# Patient Record
Sex: Female | Born: 1935 | Race: Black or African American | Hispanic: No | Smoking: Never smoker
Health system: Southern US, Community
[De-identification: ages and names within clinical notes are randomized; demographics above are authoritative.]

## PROBLEM LIST (undated history)

## (undated) DIAGNOSIS — E785 Hyperlipidemia, unspecified: Secondary | ICD-10-CM

## (undated) DIAGNOSIS — M1990 Unspecified osteoarthritis, unspecified site: Secondary | ICD-10-CM

## (undated) DIAGNOSIS — N838 Other noninflammatory disorders of ovary, fallopian tube and broad ligament: Secondary | ICD-10-CM

## (undated) DIAGNOSIS — I10 Essential (primary) hypertension: Secondary | ICD-10-CM

## (undated) DIAGNOSIS — M8580 Other specified disorders of bone density and structure, unspecified site: Secondary | ICD-10-CM

## (undated) HISTORY — DX: Hyperlipidemia, unspecified: E78.5

## (undated) HISTORY — DX: Essential (primary) hypertension: I10

## (undated) HISTORY — DX: Other specified disorders of bone density and structure, unspecified site: M85.80

## (undated) HISTORY — DX: Unspecified osteoarthritis, unspecified site: M19.90

---

## 1997-09-30 ENCOUNTER — Ambulatory Visit: Admission: RE | Admit: 1997-09-30 | Discharge: 1997-09-30 | Payer: Self-pay | Admitting: Internal Medicine

## 1998-10-05 ENCOUNTER — Encounter: Payer: Self-pay | Admitting: Internal Medicine

## 1998-10-05 ENCOUNTER — Ambulatory Visit (HOSPITAL_COMMUNITY): Admission: RE | Admit: 1998-10-05 | Discharge: 1998-10-05 | Payer: Self-pay | Admitting: Internal Medicine

## 1999-10-06 ENCOUNTER — Ambulatory Visit (HOSPITAL_COMMUNITY): Admission: RE | Admit: 1999-10-06 | Discharge: 1999-10-06 | Payer: Self-pay | Admitting: Radiation Oncology

## 2000-10-06 ENCOUNTER — Encounter: Admission: RE | Admit: 2000-10-06 | Discharge: 2000-10-06 | Payer: Self-pay | Admitting: Radiation Oncology

## 2000-12-07 ENCOUNTER — Emergency Department (HOSPITAL_COMMUNITY): Admission: EM | Admit: 2000-12-07 | Discharge: 2000-12-07 | Payer: Self-pay | Admitting: *Deleted

## 2001-10-30 ENCOUNTER — Encounter: Admission: RE | Admit: 2001-10-30 | Discharge: 2001-10-30 | Payer: Self-pay | Admitting: Internal Medicine

## 2001-10-30 ENCOUNTER — Encounter: Payer: Self-pay | Admitting: Internal Medicine

## 2002-11-01 ENCOUNTER — Encounter: Payer: Self-pay | Admitting: Internal Medicine

## 2002-11-01 ENCOUNTER — Encounter: Admission: RE | Admit: 2002-11-01 | Discharge: 2002-11-01 | Payer: Self-pay | Admitting: Internal Medicine

## 2003-11-03 ENCOUNTER — Encounter: Admission: RE | Admit: 2003-11-03 | Discharge: 2003-11-03 | Payer: Self-pay | Admitting: Internal Medicine

## 2004-11-08 ENCOUNTER — Encounter: Admission: RE | Admit: 2004-11-08 | Discharge: 2004-11-08 | Payer: Self-pay | Admitting: Internal Medicine

## 2005-05-17 ENCOUNTER — Other Ambulatory Visit: Admission: RE | Admit: 2005-05-17 | Discharge: 2005-05-17 | Payer: Self-pay | Admitting: Internal Medicine

## 2005-11-10 ENCOUNTER — Encounter: Admission: RE | Admit: 2005-11-10 | Discharge: 2005-11-10 | Payer: Self-pay | Admitting: Internal Medicine

## 2006-11-13 ENCOUNTER — Encounter: Admission: RE | Admit: 2006-11-13 | Discharge: 2006-11-13 | Payer: Self-pay | Admitting: Internal Medicine

## 2007-11-14 ENCOUNTER — Encounter: Admission: RE | Admit: 2007-11-14 | Discharge: 2007-11-14 | Payer: Self-pay | Admitting: Internal Medicine

## 2008-11-14 ENCOUNTER — Encounter: Admission: RE | Admit: 2008-11-14 | Discharge: 2008-11-14 | Payer: Self-pay | Admitting: Internal Medicine

## 2009-11-18 ENCOUNTER — Encounter: Admission: RE | Admit: 2009-11-18 | Discharge: 2009-11-18 | Payer: Self-pay | Admitting: Internal Medicine

## 2010-11-22 ENCOUNTER — Other Ambulatory Visit: Payer: Self-pay | Admitting: Internal Medicine

## 2010-11-22 DIAGNOSIS — Z1231 Encounter for screening mammogram for malignant neoplasm of breast: Secondary | ICD-10-CM

## 2010-12-17 ENCOUNTER — Ambulatory Visit
Admission: RE | Admit: 2010-12-17 | Discharge: 2010-12-17 | Disposition: A | Discharge status: Home / Self Care | Payer: PRIVATE HEALTH INSURANCE | Source: Ambulatory Visit | Attending: Internal Medicine | Admitting: Internal Medicine

## 2010-12-17 DIAGNOSIS — Z1231 Encounter for screening mammogram for malignant neoplasm of breast: Secondary | ICD-10-CM

## 2011-12-15 ENCOUNTER — Other Ambulatory Visit: Payer: Self-pay | Admitting: Internal Medicine

## 2011-12-15 DIAGNOSIS — Z1231 Encounter for screening mammogram for malignant neoplasm of breast: Secondary | ICD-10-CM

## 2012-02-06 ENCOUNTER — Ambulatory Visit
Admission: RE | Admit: 2012-02-06 | Discharge: 2012-02-06 | Disposition: A | Discharge status: Home / Self Care | Payer: Medicare HMO | Source: Ambulatory Visit | Attending: Internal Medicine | Admitting: Internal Medicine

## 2012-02-06 DIAGNOSIS — Z1231 Encounter for screening mammogram for malignant neoplasm of breast: Secondary | ICD-10-CM

## 2012-02-09 ENCOUNTER — Other Ambulatory Visit: Payer: Self-pay | Admitting: Internal Medicine

## 2012-02-09 DIAGNOSIS — R928 Other abnormal and inconclusive findings on diagnostic imaging of breast: Secondary | ICD-10-CM

## 2012-02-16 ENCOUNTER — Other Ambulatory Visit: Payer: Medicare HMO

## 2012-02-22 ENCOUNTER — Other Ambulatory Visit: Payer: Medicare HMO

## 2012-03-26 ENCOUNTER — Other Ambulatory Visit: Payer: Medicare HMO

## 2012-04-05 ENCOUNTER — Ambulatory Visit
Admission: RE | Admit: 2012-04-05 | Discharge: 2012-04-05 | Disposition: A | Discharge status: Home / Self Care | Payer: Medicare HMO | Source: Ambulatory Visit | Attending: Internal Medicine | Admitting: Internal Medicine

## 2012-04-05 DIAGNOSIS — R928 Other abnormal and inconclusive findings on diagnostic imaging of breast: Secondary | ICD-10-CM

## 2013-04-08 ENCOUNTER — Other Ambulatory Visit: Payer: Self-pay | Admitting: Internal Medicine

## 2013-04-08 DIAGNOSIS — R198 Other specified symptoms and signs involving the digestive system and abdomen: Secondary | ICD-10-CM

## 2013-04-11 ENCOUNTER — Ambulatory Visit
Admission: RE | Admit: 2013-04-11 | Discharge: 2013-04-11 | Disposition: A | Discharge status: Home / Self Care | Payer: Medicare HMO | Source: Ambulatory Visit | Attending: Internal Medicine | Admitting: Internal Medicine

## 2013-04-11 DIAGNOSIS — R198 Other specified symptoms and signs involving the digestive system and abdomen: Secondary | ICD-10-CM

## 2013-05-30 DIAGNOSIS — R1900 Intra-abdominal and pelvic swelling, mass and lump, unspecified site: Secondary | ICD-10-CM | POA: Insufficient documentation

## 2014-04-14 ENCOUNTER — Other Ambulatory Visit: Payer: Self-pay | Admitting: Internal Medicine

## 2014-04-14 DIAGNOSIS — Z1231 Encounter for screening mammogram for malignant neoplasm of breast: Secondary | ICD-10-CM

## 2014-04-14 DIAGNOSIS — Z9889 Other specified postprocedural states: Secondary | ICD-10-CM

## 2014-04-14 DIAGNOSIS — Z853 Personal history of malignant neoplasm of breast: Secondary | ICD-10-CM

## 2014-04-24 ENCOUNTER — Ambulatory Visit
Admission: RE | Admit: 2014-04-24 | Discharge: 2014-04-24 | Disposition: A | Discharge status: Home / Self Care | Payer: Medicare HMO | Source: Ambulatory Visit | Attending: Internal Medicine | Admitting: Internal Medicine

## 2014-04-24 DIAGNOSIS — Z9889 Other specified postprocedural states: Secondary | ICD-10-CM

## 2014-04-24 DIAGNOSIS — Z1231 Encounter for screening mammogram for malignant neoplasm of breast: Secondary | ICD-10-CM

## 2014-04-24 DIAGNOSIS — Z853 Personal history of malignant neoplasm of breast: Secondary | ICD-10-CM

## 2017-05-15 ENCOUNTER — Observation Stay (HOSPITAL_COMMUNITY)
Admission: EM | Admit: 2017-05-15 | Discharge: 2017-05-19 | Disposition: A | Discharge status: Home / Self Care | Payer: Medicare Other | Attending: Internal Medicine | Admitting: Internal Medicine

## 2017-05-15 ENCOUNTER — Emergency Department (HOSPITAL_COMMUNITY): Payer: Medicare Other

## 2017-05-15 ENCOUNTER — Other Ambulatory Visit: Payer: Self-pay

## 2017-05-15 DIAGNOSIS — E785 Hyperlipidemia, unspecified: Secondary | ICD-10-CM | POA: Diagnosis not present

## 2017-05-15 DIAGNOSIS — R531 Weakness: Secondary | ICD-10-CM | POA: Insufficient documentation

## 2017-05-15 DIAGNOSIS — N63 Unspecified lump in unspecified breast: Secondary | ICD-10-CM | POA: Diagnosis present

## 2017-05-15 DIAGNOSIS — N839 Noninflammatory disorder of ovary, fallopian tube and broad ligament, unspecified: Principal | ICD-10-CM | POA: Insufficient documentation

## 2017-05-15 DIAGNOSIS — M8580 Other specified disorders of bone density and structure, unspecified site: Secondary | ICD-10-CM | POA: Insufficient documentation

## 2017-05-15 DIAGNOSIS — M1990 Unspecified osteoarthritis, unspecified site: Secondary | ICD-10-CM | POA: Diagnosis not present

## 2017-05-15 DIAGNOSIS — N838 Other noninflammatory disorders of ovary, fallopian tube and broad ligament: Secondary | ICD-10-CM | POA: Diagnosis present

## 2017-05-15 DIAGNOSIS — R112 Nausea with vomiting, unspecified: Secondary | ICD-10-CM | POA: Diagnosis present

## 2017-05-15 DIAGNOSIS — R339 Retention of urine, unspecified: Secondary | ICD-10-CM | POA: Insufficient documentation

## 2017-05-15 DIAGNOSIS — M109 Gout, unspecified: Secondary | ICD-10-CM | POA: Diagnosis not present

## 2017-05-15 DIAGNOSIS — I10 Essential (primary) hypertension: Secondary | ICD-10-CM

## 2017-05-15 DIAGNOSIS — Z79899 Other long term (current) drug therapy: Secondary | ICD-10-CM | POA: Insufficient documentation

## 2017-05-15 DIAGNOSIS — Z853 Personal history of malignant neoplasm of breast: Secondary | ICD-10-CM | POA: Insufficient documentation

## 2017-05-15 DIAGNOSIS — R270 Ataxia, unspecified: Secondary | ICD-10-CM | POA: Diagnosis not present

## 2017-05-15 DIAGNOSIS — N179 Acute kidney failure, unspecified: Secondary | ICD-10-CM | POA: Insufficient documentation

## 2017-05-15 DIAGNOSIS — Z66 Do not resuscitate: Secondary | ICD-10-CM | POA: Diagnosis not present

## 2017-05-15 DIAGNOSIS — E860 Dehydration: Secondary | ICD-10-CM | POA: Diagnosis not present

## 2017-05-15 DIAGNOSIS — N6320 Unspecified lump in the left breast, unspecified quadrant: Secondary | ICD-10-CM | POA: Diagnosis not present

## 2017-05-15 DIAGNOSIS — M810 Age-related osteoporosis without current pathological fracture: Secondary | ICD-10-CM | POA: Diagnosis not present

## 2017-05-15 DIAGNOSIS — I700 Atherosclerosis of aorta: Secondary | ICD-10-CM | POA: Insufficient documentation

## 2017-05-15 DIAGNOSIS — K8020 Calculus of gallbladder without cholecystitis without obstruction: Secondary | ICD-10-CM | POA: Insufficient documentation

## 2017-05-15 DIAGNOSIS — Z923 Personal history of irradiation: Secondary | ICD-10-CM | POA: Insufficient documentation

## 2017-05-15 DIAGNOSIS — J90 Pleural effusion, not elsewhere classified: Secondary | ICD-10-CM | POA: Insufficient documentation

## 2017-05-15 DIAGNOSIS — N189 Chronic kidney disease, unspecified: Secondary | ICD-10-CM | POA: Insufficient documentation

## 2017-05-15 DIAGNOSIS — R2689 Other abnormalities of gait and mobility: Secondary | ICD-10-CM | POA: Insufficient documentation

## 2017-05-15 DIAGNOSIS — I129 Hypertensive chronic kidney disease with stage 1 through stage 4 chronic kidney disease, or unspecified chronic kidney disease: Secondary | ICD-10-CM | POA: Insufficient documentation

## 2017-05-15 DIAGNOSIS — Z7982 Long term (current) use of aspirin: Secondary | ICD-10-CM | POA: Diagnosis not present

## 2017-05-15 DIAGNOSIS — J320 Chronic maxillary sinusitis: Secondary | ICD-10-CM | POA: Insufficient documentation

## 2017-05-15 DIAGNOSIS — K5730 Diverticulosis of large intestine without perforation or abscess without bleeding: Secondary | ICD-10-CM | POA: Insufficient documentation

## 2017-05-15 HISTORY — DX: Other noninflammatory disorders of ovary, fallopian tube and broad ligament: N83.8

## 2017-05-15 LAB — COMPREHENSIVE METABOLIC PANEL
ALK PHOS: 60 U/L (ref 38–126)
ALT: 15 U/L (ref 14–54)
ANION GAP: 12 (ref 5–15)
AST: 25 U/L (ref 15–41)
Albumin: 4.1 g/dL (ref 3.5–5.0)
BILIRUBIN TOTAL: 0.8 mg/dL (ref 0.3–1.2)
BUN: 21 mg/dL — ABNORMAL HIGH (ref 6–20)
CALCIUM: 9.7 mg/dL (ref 8.9–10.3)
CO2: 23 mmol/L (ref 22–32)
Chloride: 107 mmol/L (ref 101–111)
Creatinine, Ser: 1.41 mg/dL — ABNORMAL HIGH (ref 0.44–1.00)
GFR calc Af Amer: 36 mL/min — ABNORMAL LOW (ref >60)
GFR, EST NON AFRICAN AMERICAN: 31 mL/min — AB (ref >60)
Glucose, Bld: 203 mg/dL — ABNORMAL HIGH (ref 65–99)
Potassium: 4 mmol/L (ref 3.5–5.1)
Sodium: 142 mmol/L (ref 135–145)
TOTAL PROTEIN: 7.6 g/dL (ref 6.5–8.1)

## 2017-05-15 LAB — URINALYSIS, ROUTINE W REFLEX MICROSCOPIC
BACTERIA UA: NONE SEEN
Bilirubin Urine: NEGATIVE
GLUCOSE, UA: NEGATIVE mg/dL
HGB URINE DIPSTICK: NEGATIVE
Ketones, ur: 5 mg/dL — AB
NITRITE: NEGATIVE
PH: 8 (ref 5.0–8.0)
Protein, ur: 30 mg/dL — AB
SPECIFIC GRAVITY, URINE: 1.014 (ref 1.005–1.030)

## 2017-05-15 LAB — CBC WITH DIFFERENTIAL/PLATELET
Basophils Absolute: 0 10*3/uL (ref 0.0–0.1)
Basophils Relative: 0 %
EOS ABS: 0.1 10*3/uL (ref 0.0–0.7)
Eosinophils Relative: 0 %
HCT: 39 % (ref 36.0–46.0)
HEMOGLOBIN: 12.6 g/dL (ref 12.0–15.0)
Lymphocytes Relative: 5 %
Lymphs Abs: 0.6 10*3/uL — ABNORMAL LOW (ref 0.7–4.0)
MCH: 28.8 pg (ref 26.0–34.0)
MCHC: 32.3 g/dL (ref 30.0–36.0)
MCV: 89 fL (ref 78.0–100.0)
MONO ABS: 0.5 10*3/uL (ref 0.1–1.0)
MONOS PCT: 4 %
NEUTROS ABS: 11.1 10*3/uL — AB (ref 1.7–7.7)
NEUTROS PCT: 91 %
Platelets: 178 10*3/uL (ref 150–400)
RBC: 4.38 MIL/uL (ref 3.87–5.11)
RDW: 12.9 % (ref 11.5–15.5)
WBC: 12.4 10*3/uL — ABNORMAL HIGH (ref 4.0–10.5)

## 2017-05-15 LAB — I-STAT TROPONIN, ED: TROPONIN I, POC: 0 ng/mL (ref 0.00–0.08)

## 2017-05-15 LAB — LIPASE, BLOOD: LIPASE: 27 U/L (ref 11–51)

## 2017-05-15 MED ORDER — IOPAMIDOL (ISOVUE-300) INJECTION 61%
80.0000 mL | Freq: Once | INTRAVENOUS | Status: AC | PRN
  Administered 2017-05-15: 100 mL via INTRAVENOUS

## 2017-05-15 MED ORDER — SODIUM CHLORIDE 0.9 % IJ SOLN
INTRAMUSCULAR | Status: AC
  Filled 2017-05-15: qty 50

## 2017-05-15 MED ORDER — ONDANSETRON HCL 4 MG/2ML IJ SOLN
4.0000 mg | Freq: Once | INTRAMUSCULAR | Status: AC
  Administered 2017-05-15: 4 mg via INTRAVENOUS
  Filled 2017-05-15: qty 2

## 2017-05-15 MED ORDER — ONDANSETRON HCL 4 MG/2ML IJ SOLN
4.0000 mg | Freq: Once | INTRAMUSCULAR | Status: AC
Start: 2017-05-15 — End: 2017-05-15
  Administered 2017-05-15: 4 mg via INTRAVENOUS
  Filled 2017-05-15: qty 2

## 2017-05-15 MED ORDER — IOPAMIDOL (ISOVUE-300) INJECTION 61%
INTRAVENOUS | Status: AC
  Filled 2017-05-15: qty 100

## 2017-05-15 MED ORDER — SODIUM CHLORIDE 0.9 % IV BOLUS
1000.0000 mL | Freq: Once | INTRAVENOUS | Status: AC
  Administered 2017-05-16: 1000 mL via INTRAVENOUS

## 2017-05-15 NOTE — ED Provider Notes (Addendum)
[facility]-EMERGENCY DEPT Provider Note   CSN: [id number] Arrival date & time: 05/15/17  1830     History   Chief Complaint Chief Complaint  Patient presents with  . Nausea  . Emesis    HPI Ann Cameron is a 84 y.o. female with history of hypertension, hyperlipidemia,breast cancer status post radiation tx,pelvic mass is here for evaluation of sudden onset nausea and nonbilious nonbloody emesis around 3:30 PM today. Had prodromal brief episode of dizziness while sitting down before nausea and vomiting. Last intake was lunch around noon. States that she threw up what she had eaten for lunch. Has had persistent nausea and emesis since, feels like emesis is slowing down but nausea is persistent. Last BM was normal this afternoon. Other associated symptoms include "abdominal discomfort" while actively vomiting, no abdominal discomfort otherwise. She has had no fevers, chest pain, shortness of breath, cough, abdominal pain, dysuria, hematuria, urinary frequency,changes in bowel movements. No known sick contacts at living facility. States she has a known mass in her abdomen, has decided to avoid invasive management for this. She follows up with Dr. Nehemiah SettlePolite every 6 months for routine checkup. Alleviating factors: zofran. Aggravating factors: none.   HPI  Past Medical History:  Diagnosis Date  . Arthritis   . HTN (hypertension)   . Hyperlipemia   . Osteopenia   . Ovarian mass     Patient Active Problem List   Diagnosis Date Noted  . Ovarian mass 05/16/2017    ** The histories are not reviewed yet. Please review them in the "History" navigator section and refresh this SmartLink.   OB History   None      Home Medications    Prior to Admission medications   Medication Sig Start Date End Date Taking? Authorizing Provider  amLODipine (NORVASC) 10 MG tablet Take 10 mg by mouth daily.   Yes [provider]  aspirin 81 MG tablet Take 81 mg by mouth  daily.   Yes [provider]  Calcium Carbonate-Vitamin D (CALCIUM-D) 600-400 MG-UNIT TABS Take 1 tablet by mouth daily. With food   Yes [provider]  fosinopril (MONOPRIL) 20 MG tablet Take 20 mg by mouth daily.   Yes [provider]  LUMIGAN 0.01 % SOLN Place 1 drop into both eyes at bedtime.  03/20/17  Yes [provider]  meloxicam (MOBIC) 7.5 MG tablet Take 7.5 mg by mouth daily.   Yes [provider]  Multiple Vitamins-Minerals (MULTIVITAMIN WITH MINERALS) tablet Take 1 tablet by mouth daily.   Yes [provider]    Family History No family history on file.  Social History Social History   Tobacco Use  . Smoking status: Not on file  Substance Use Topics  . Alcohol use: Not on file  . Drug use: Not on file     Allergies   Patient has no known allergies.   Review of Systems Review of Systems  Gastrointestinal: Positive for nausea and vomiting.  All other systems reviewed and are negative.    Physical Exam Updated Vital Signs BP (!) 155/60   Pulse 85   Temp 97.8 F (36.6 C) (Oral)   Resp 17   Ht 5\' 1"  (1.549 m)   Wt 67.6 kg (149 lb)   SpO2 95%   BMI 28.15 kg/m   Physical Exam  Constitutional: She is oriented to person, place, and time. She appears well-developed and well-nourished. No distress.  Non toxic  HENT:  Head: Normocephalic  and atraumatic.  Nose: Nose normal.  Mouth/Throat: No oropharyngeal exudate.  Moist mucous membranes   Eyes: Pupils are equal, round, and reactive to light. Conjunctivae and EOM are normal.  Neck: Normal range of motion.  Cardiovascular: Normal rate, regular rhythm and intact distal pulses.  No murmur heard. 2+ DP and radial pulses bilaterally. No LE edema.   Pulmonary/Chest: Effort normal and breath sounds normal. No respiratory distress. She has no wheezes. She has no rales.  Abdominal: Soft. Bowel sounds are normal. She exhibits distension and mass. There is no  tenderness.  Moderate abdominal distention noted. Palpable, mass to lower abdomen extending up to the umbilicus, this is nontender with dullness to percussion. No guarding, rebound. No suprapubic or CVA tenderness. Diminished bowel sounds.  Musculoskeletal: Normal range of motion. She exhibits no deformity.  Neurological: She is alert and oriented to person, place, and time.  Skin: Skin is warm and dry. Capillary refill takes less than 2 seconds.  Psychiatric: She has a normal mood and affect. Her behavior is normal. Judgment and thought content normal.  Nursing note and vitals reviewed.    ED Treatments / Results  Labs (all labs ordered are listed, but only abnormal results are displayed) Labs Reviewed  CBC WITH DIFFERENTIAL/PLATELET - Abnormal; Notable for the following components:      Result Value   WBC 12.4 (*)    Neutro Abs 11.1 (*)    Lymphs Abs 0.6 (*)    All other components within normal limits  COMPREHENSIVE METABOLIC PANEL - Abnormal; Notable for the following components:   Glucose, Bld 203 (*)    BUN 21 (*)    Creatinine, Ser 1.41 (*)    GFR calc non Af Amer 31 (*)    GFR calc Af Amer 36 (*)    All other components within normal limits  URINALYSIS, ROUTINE W REFLEX MICROSCOPIC - Abnormal; Notable for the following components:   Ketones, ur 5 (*)    Protein, ur 30 (*)    Leukocytes, UA TRACE (*)    Squamous Epithelial / LPF 0-5 (*)    All other components within normal limits  URINE CULTURE  LIPASE, BLOOD  I-STAT TROPONIN, ED    EKG EKG Interpretation  Date/Time:  Monday May 15 2017 20:07:23 EDT Ventricular Rate:  89 PR Interval:    QRS Duration: 82 QT Interval:  399 QTC Calculation: 486 R Axis:   18 Text Interpretation:  Sinus rhythm Atrial premature complex Low voltage, precordial leads Borderline prolonged QT interval No previous ECGs available Confirmed by Alvira Monday ([id number]) on 05/15/2017 9:46:13 PM   Radiology Ct Abdomen Pelvis W  Contrast  Result Date: 05/15/2017 CLINICAL DATA:  Nausea and vomiting EXAM: CT ABDOMEN AND PELVIS WITH CONTRAST TECHNIQUE: Multidetector CT imaging of the abdomen and pelvis was performed using the standard protocol following bolus administration of intravenous contrast. CONTRAST:  ISOVUE-300 IOPAMIDOL (ISOVUE-300) INJECTION 61% COMPARISON:  04/11/2013 FINDINGS: Lower chest: Lung bases demonstrate no acute consolidation or pleural effusion. Borderline cardiomegaly. Partially visualized 19 mm mass in the outer left breast. Mild esophageal hiatal hernia. Hepatobiliary: No focal hepatic abnormality. Calcified stones in the gallbladder. Possible mild gallbladder wall thickening or edema. No biliary dilatation Pancreas: Unremarkable. No pancreatic ductal dilatation or surrounding inflammatory changes. Spleen: Normal in size without focal abnormality. Adrenals/Urinary Tract: Adrenal glands are within normal limits. Indeterminate hypodense lesion upper pole left kidney measuring 1 cm. Subcentimeter hypodense renal lesions too small to further characterize. No hydronephrosis. Distended bladder. Stomach/Bowel:  Stomach nonenlarged. No dilated small bowel. Diffuse diverticular disease of the colon without acute inflammation. Vascular/Lymphatic: Moderate aortic atherosclerosis. No aneurysmal dilatation. No significantly enlarged lymph nodes. Reproductive: Interval increase in size of a large complex cystic mass filling the abdomen and pelvis, now measuring approximately 26 cm transverse by 15.5 cm AP by 17.7 cm craniocaudad Other: Negative for free air or significant free fluid. Musculoskeletal: Stable sclerotic lesion in L3. Grade 1 anterolisthesis of L4 on L5. Degenerative changes at L5-S1. IMPRESSION: 1. Interval increase in size of a massive complex cystic and solid mass with calcifications, now measuring up to 26 cm, concerning for ovarian neoplasm. 2. Gallstones. Possible wall thickening or gallbladder wall edema.  Correlation with right upper quadrant abdominal ultrasound suggested. 3. Diffuse diverticular disease of the colon without acute inflammation. 4. Partially visualized 19 mm slightly dense mass in the outer left breast. Correlation with outpatient mammography and ultrasound if felt clinically indicated. Electronically Signed   By: Jasmine Pang M.D.   On: 05/15/2017 23:09    Procedures Procedures (including critical care time)  Medications Ordered in ED Medications  iopamidol (ISOVUE-300) 61 % injection (has no administration in time range)  sodium chloride 0.9 % injection (has no administration in time range)  sodium chloride 0.9 % bolus 1,000 mL (has no administration in time range)  ondansetron (ZOFRAN) injection 4 mg (4 mg Intravenous Given 05/15/17 1933)  iopamidol (ISOVUE-300) 61 % injection 80 mL (100 mLs Intravenous Contrast Given 05/15/17 2222)  ondansetron (ZOFRAN) injection 4 mg (4 mg Intravenous Given 05/15/17 2245)     Initial Impression / Assessment and Plan / ED Course  I have reviewed the triage vital signs and the nursing notes.  Pertinent labs & imaging results that were available during my care of the patient were reviewed by me and considered in my medical decision making (see chart for details).  Clinical Course as of Apr 23 0001  Mon May 15, 2017  2114 Creatinine(!): 1.41 [CG]  2114 GFR, Est African American(!): 36 [CG]  2114 WBC(!): 12.4 [CG]  2120 Patient states she has had emesis every 45 minutes since arrival in the ED . Her nausea has improved. Abdominal CT ordered.   [MA]  2141 Re-evaluated pt. VSS. Denies nausea, reports 1-2 episodes of nbnb emesis while in Ed.    [CG]  2318 IMPRESSION: 1. Interval increase in size of a massive complex cystic and solid mass with calcifications, now measuring up to 26 cm, concerning for ovarian neoplasm. 2. Gallstones. Possible wall thickening or gallbladder wall edema. Correlation with right upper quadrant abdominal  ultrasound suggested. 3. Diffuse diverticular disease of the colon without acute inflammation. 4. Partially visualized 19 mm slightly dense mass in the outer left breast. Correlation with outpatient mammography and ultrasound if felt clinically indicated.    CT ABDOMEN PELVIS W CONTRAST [CG]    Clinical Course User Index [CG] Liberty Handy, PA-C [MA] Roma Kayser, Student-PA   Ddx includes gastroenteritis. She has a known large pelvic mass per last heme/oncology note which is palpable but non tender on exam today.  Also considering SBO, perforated viscus, pyelonephritis/UTI less likely. She has no CP/SOB to suggest ACS/MI.   Labs remarkable for elevated creatinine 1.41 unchanged when compared with last CMP on 11/15/16. Mild leukocytosis WBC 12.4. Given known pelvic mass, sudden onset nausea and vomiting, will obtain CT abdomen/pelvis.   Final Clinical Impressions(s) / ED Diagnoses   2330: CT scan shows massive pelvis mass, enlarging, concern for ovarian neoplasm.  There is possible mild wall thickening/edema of gallbladder with gallstones seen on CT but she has no abd tenderness.  Will obtain a right upper quadrant ultrasound. I discussed findings with patient. States that she was seen by an OB/GYN 3 years ago when pelvic mass was found, remembers she was told it was "maybe benign". Her sister was very ill during this time and she had no associated symptoms and has not had subsequent f/u for this. Has noticed more rapid enlargement in the last 4 months.  Had brief discussion with patient regarding goals of care, she is very aware of risk of invasive procedures and is contemplating her options. Will request admission for further management of this, potential surgery consult vs palliative consult. Pt shared with Dr. Kathrine Haddock.  Blue folder at bedside with [facility] and DNR status  Final diagnoses:  Gallstones    ED Discharge Orders    None         Jerrell Mylar 05/16/17 0005    Alvira Monday, MD 05/16/17 1041

## 2017-05-15 NOTE — H&P (Signed)
Ann Cameron:[id number] DOB: 1922-10-17 DOA: 05/15/2017     PCP: Renford Dills, MD   Outpatient Specialists:  NONE  Patient arrived to ER on 05/15/17 at 1830  Patient coming from:  [facility]  Chief Complaint:  Chief Complaint  Patient presents with  . Nausea  . Emesis    HPI: Ann Cameron is a 84 y.o. female with medical history significant of pelvic mass , HTN, dyslipidemia, osteoporosis, breast cancer status post radiation and lumpectomy, gout, CKD baseline cr 1.4  Presented with vomiting started today EMS was called initially orthostatics were negative on the arrival blood pressure 176/82 She did have a slight episode of lightheadedness prior to initiation nausea and vomiting she vomited which she had for lunch.  She has had persistent nausea ever since but vomiting have stopped no associated fevers chest pain or shortness of breath.  She endorses that she has been feeling unsteady on her feet no localizing neurological deficits otherwise.  Reports about a year ago she has noticed a mass in her left breast she has history of breast cancer diagnosed in 1991 in the same breast.  Mass feels similar  Regarding pertinent Chronic problems: Known pelvic mass diagnosed in 2015 patient chose to not undergo surgical resection   While in ER:   Following Medications were ordered in ER: Medications  iopamidol (ISOVUE-300) 61 % injection (has no administration in time range)  sodium chloride 0.9 % injection (has no administration in time range)  ondansetron (ZOFRAN) injection 4 mg (4 mg Intravenous Given 05/15/17 1933)  iopamidol (ISOVUE-300) 61 % injection 80 mL (100 mLs Intravenous Contrast Given 05/15/17 2222)  ondansetron (ZOFRAN) injection 4 mg (4 mg Intravenous Given 05/15/17 2245)    Significant initial  Findings: Abnormal Labs Reviewed  CBC WITH DIFFERENTIAL/PLATELET - Abnormal; Notable for the following components:   Result Value   WBC 12.4 (*)    Neutro Abs 11.1 (*)    Lymphs Abs 0.6 (*)    All other components within normal limits  COMPREHENSIVE METABOLIC PANEL - Abnormal; Notable for the following components:   Glucose, Bld 203 (*)    BUN 21 (*)    Creatinine, Ser 1.41 (*)    GFR calc non Af Amer 31 (*)    GFR calc Af Amer 36 (*)    All other components within normal limits  URINALYSIS, ROUTINE W REFLEX MICROSCOPIC - Abnormal; Notable for the following components:   Ketones, ur 5 (*)    Protein, ur 30 (*)    Leukocytes, UA TRACE (*)    Squamous Epithelial / LPF 0-5 (*)    All other components within normal limits     Na 142 K 4.0 Lipase 27 Cr  baseline Lab Results  Component Value Date   CREATININE 1.41 (H) 05/15/2017     HG/HCT  Stable,     Component Value Date/Time   HGB 12.6 05/15/2017 1942   HCT 39.0 05/15/2017 1942   Troponin (Point of Care Test) Recent Labs    05/15/17 2043  TROPIPOC 0.00     BNP (last 3 results) No results for input(s): BNP in the last 8760 hours.  ProBNP (last 3 results) No results for input(s): PROBNP in the last 8760 hours.  Lactic Acid, Venous No results found for: LATICACIDVEN    UA  no evidence of UTI     CTabd/pelvis -enlargement of previously noted pelvic mass from 14 cm now up to 26  cm worrisome for ovarian neoplasm Evidence of gallstones possible mass of the left breast.  ECG:  Personally reviewed by me showing: HR  89 Rhythm:  NSR  no evidence of ischemic changes [id number]      ED Triage Vitals  Enc Vitals Group     BP 05/15/17 1850 (!) 175/69     Pulse Rate 05/15/17 1850 95     Resp 05/15/17 1850 20     Temp 05/15/17 1850 97.8 F (36.6 C)     Temp Source 05/15/17 1850 Oral     SpO2 05/15/17 1839 97 %     Weight 05/15/17 1852 149 lb (67.6 kg)     Height 05/15/17 1852 5\' 1"  (1.549 m)     Head Circumference --      Peak Flow --      Pain Score 05/15/17 1851 0     Pain Loc --      Pain Edu? --      Excl. in GC? --    TMAX(24)@       Latest  Blood pressure (!) 155/60, pulse 85, temperature 97.8 F (36.6 C), temperature source Oral, resp. rate 17, height 5\' 1"  (1.549 m), weight 67.6 kg (149 lb), SpO2 95 %.   Hospitalist was called for admission for nausea vomiting and worsening ovarian mass as well as new left breast mass   Review of Systems:    Pertinent positives include: lightheadedness, nausea, vomiting, vertigo  gait abnormality, Constitutional:  No weight loss, night sweats, Fevers, chills, fatigue, weight loss  HEENT:  No headaches, Difficulty swallowing,Tooth/dental problems,Sore throat,  No sneezing, itching, ear ache, nasal congestion, post nasal drip,  Cardio-vascular:  No chest pain, Orthopnea, PND, anasarca, dizziness, palpitations.no Bilateral lower extremity swelling  GI:  No heartburn, indigestion, abdominal pain, diarrhea, change in bowel habits, loss of appetite, melena, blood in stool, hematemesis Resp:  no shortness of breath at rest. No dyspnea on exertion, No excess mucus, no productive cough, No non-productive cough, No coughing up of blood.No change in color of mucus.No wheezing. Skin:  no rash or lesions. No jaundice GU:  no dysuria, change in color of urine, no urgency or frequency. No straining to urinate.  No flank pain.  Musculoskeletal:  No joint pain or no joint swelling. No decreased range of motion. No back pain.  Psych:  No change in mood or affect. No depression or anxiety. No memory loss.  Neuro: no localizing neurological complaints, no tingling, no weakness, no double vision, no no slurred speech, no confusion  As per HPI otherwise 10 point review of systems negative.   Past Medical History:   Past Medical History:  Diagnosis Date  . Arthritis   . HTN (hypertension)   . Hyperlipemia   . Osteopenia       History reviewed. No pertinent surgical history.  Social History:  Ambulatory   independently       reports that she has never smoked.  She has never used smokeless tobacco. She reports that she drank alcohol. Her drug history is not on file.     Family History:   Family History  Problem Relation Age of Onset  . Ovarian cancer Sister     Allergies: No Known Allergies   Prior to Admission medications   Medication Sig Start Date End Date Taking? Authorizing Provider  amLODipine (NORVASC) 10 MG tablet Take 10 mg by mouth daily.   Yes [provider]  aspirin 81 MG tablet Take 81 mg  by mouth daily.   Yes [provider]  Calcium Carbonate-Vitamin D (CALCIUM-D) 600-400 MG-UNIT TABS Take 1 tablet by mouth daily. With food   Yes [provider]  fosinopril (MONOPRIL) 20 MG tablet Take 20 mg by mouth daily.   Yes [provider]  LUMIGAN 0.01 % SOLN Place 1 drop into both eyes at bedtime.  03/20/17  Yes [provider]  meloxicam (MOBIC) 7.5 MG tablet Take 7.5 mg by mouth daily.   Yes [provider]  Multiple Vitamins-Minerals (MULTIVITAMIN WITH MINERALS) tablet Take 1 tablet by mouth daily.   Yes [provider]   Physical Exam: Blood pressure (!) 155/60, pulse 85, temperature 97.8 F (36.6 C), temperature source Oral, resp. rate 17, height 5\' 1"  (1.549 m), weight 67.6 kg (149 lb), SpO2 95 %. 1. General:  in No Acute distress  well  -appearing 2. Psychological: Alert and   Oriented 3. Head/ENT:     Dry Mucous Membranes                          Head Non traumatic, neck supple                          Poor Dentition 4. SKIN:  decreased Skin turgor,  Skin clean Dry and intact no rash 5. Heart: Regular rate and rhythm no  Murmur, no Rub or gallop 6. Lungs: Clear to auscultation bilaterally, no wheezes or crackles   7. Abdomen: Soft,  non-tender,   distended  bowel sounds present large abdominal mass noted  8. Lower extremities: no clubbing, cyanosis, or edema 9. Neurologically   strength 5 out of 5 in all 4 extremities cranial nerves II through XII  intact 10. MSK: Normal range of motion 11. Breast exam significant for firm immobile mass attached to skin 4 cm from the nipple at 2:00 measuring about 3 x 2 cm  LABS:     Recent Labs  Lab 05/15/17 1942  WBC 12.4*  NEUTROABS 11.1*  HGB 12.6  HCT 39.0  MCV 89.0  PLT 178   Basic Metabolic Panel: Recent Labs  Lab 05/15/17 1942  NA 142  K 4.0  CL 107  CO2 23  GLUCOSE 203*  BUN 21*  CREATININE 1.41*  CALCIUM 9.7      Recent Labs  Lab 05/15/17 1942  AST 25  ALT 15  ALKPHOS 60  BILITOT 0.8  PROT 7.6  ALBUMIN 4.1   Recent Labs  Lab 05/15/17 1942  LIPASE 27   No results for input(s): AMMONIA in the last 168 hours.    HbA1C: No results for input(s): HGBA1C in the last 72 hours. CBG: No results for input(s): GLUCAP in the last 168 hours.    Urine analysis:    Component Value Date/Time   COLORURINE YELLOW 05/15/2017 2222   APPEARANCEUR CLEAR 05/15/2017 2222   LABSPEC 1.014 05/15/2017 2222   PHURINE 8.0 05/15/2017 2222   GLUCOSEU NEGATIVE 05/15/2017 2222   HGBUR NEGATIVE 05/15/2017 2222   BILIRUBINUR NEGATIVE 05/15/2017 2222   KETONESUR 5 (A) 05/15/2017 2222   PROTEINUR 30 (A) 05/15/2017 2222   NITRITE NEGATIVE 05/15/2017 2222   LEUKOCYTESUR TRACE (A) 05/15/2017 2222       Cultures: No results found for: SDES, SPECREQUEST, CULT, REPTSTATUS   Radiological Exams on Admission: Ct Abdomen Pelvis W Contrast  Result Date: 05/15/2017 CLINICAL DATA:  Nausea and vomiting EXAM: CT ABDOMEN AND  PELVIS WITH CONTRAST TECHNIQUE: Multidetector CT imaging of the abdomen and pelvis was performed using the standard protocol following bolus administration of intravenous contrast. CONTRAST:  ISOVUE-300 IOPAMIDOL (ISOVUE-300) INJECTION 61% COMPARISON:  04/11/2013 FINDINGS: Lower chest: Lung bases demonstrate no acute consolidation or pleural effusion. Borderline cardiomegaly. Partially visualized 19 mm mass in the outer left breast. Mild esophageal hiatal hernia.  Hepatobiliary: No focal hepatic abnormality. Calcified stones in the gallbladder. Possible mild gallbladder wall thickening or edema. No biliary dilatation Pancreas: Unremarkable. No pancreatic ductal dilatation or surrounding inflammatory changes. Spleen: Normal in size without focal abnormality. Adrenals/Urinary Tract: Adrenal glands are within normal limits. Indeterminate hypodense lesion upper pole left kidney measuring 1 cm. Subcentimeter hypodense renal lesions too small to further characterize. No hydronephrosis. Distended bladder. Stomach/Bowel: Stomach nonenlarged. No dilated small bowel. Diffuse diverticular disease of the colon without acute inflammation. Vascular/Lymphatic: Moderate aortic atherosclerosis. No aneurysmal dilatation. No significantly enlarged lymph nodes. Reproductive: Interval increase in size of a large complex cystic mass filling the abdomen and pelvis, now measuring approximately 26 cm transverse by 15.5 cm AP by 17.7 cm craniocaudad Other: Negative for free air or significant free fluid. Musculoskeletal: Stable sclerotic lesion in L3. Grade 1 anterolisthesis of L4 on L5. Degenerative changes at L5-S1. IMPRESSION: 1. Interval increase in size of a massive complex cystic and solid mass with calcifications, now measuring up to 26 cm, concerning for ovarian neoplasm. 2. Gallstones. Possible wall thickening or gallbladder wall edema. Correlation with right upper quadrant abdominal ultrasound suggested. 3. Diffuse diverticular disease of the colon without acute inflammation. 4. Partially visualized 19 mm slightly dense mass in the outer left breast. Correlation with outpatient mammography and ultrasound if felt clinically indicated. Electronically Signed   By: Jasmine Pang M.D.   On: 05/15/2017 23:09    Chart has been reviewed    Assessment/Plan   84 y.o. female with medical history significant of pelvic mass , HTN, dyslipidemia, osteoporosis, breast cancer status post radiation  and lumpectomy, gout, CKD baseline cr 1.4  Admitted for nausea vomiting and worsening ovarian mass as well as new left breast mass   Present on Admission: . Ovarian mass -most likely ovarian malignancy with progression.  Patient this point states she is unlikely to desire surgical intervention but would like to have her options first explored.  Would be interested in palliative care discussion palliative care consult. . Cholelithiasis -will obtain right upper quadrant ultrasound to further investigate if evidence of cholecystitis patient states she would be interested in pursuing management . HTN (hypertension) stable continue home medications . Nausea and vomiting -multifactorial but given neurological associated complaints such as vertigo and ataxia with known history of likely malignancy will obtain brain imaging.  Given advanced age will cycle cardiac enzymes given lightheadedness nausea and vomiting . Breast mass in female -this is new diagnosis but patient has been aware of a mass for the past year.  She is unsure if like further workup or not given her advanced age.  Would like further information prior to do making a decision . Dehydration will rehydrate CKD creatinine currently stable  Other plan as per orders.  DVT prophylaxis:  SCD     Code Status:   DNR/DNI as per patient   I had personally discussed CODE STATUS with patient   I had spent discussing goals of care and CODE STATUS  Family Communication:   Family not  at  Bedside    Disposition Plan:  Back to current facility when stable                          Would benefit from PT/OT eval prior to DC   ordered                 Social Work  Palliative care   consulted                          Consults called: none   Admission status   inpatient      Level of care     tele        Therisa Doyne 05/15/2017, 12:55 AM    [facility]  Pager [phone]   after 2 AM please page  floor coverage PA If 7AM-7PM, please contact the day team taking care of the patient  [email or web address]  Password [email or web address]

## 2017-05-15 NOTE — ED Notes (Signed)
ED Provider at bedside. 

## 2017-05-15 NOTE — ED Notes (Signed)
Bed: [id number] Expected date:  Expected time:  Means of arrival:  Comments: 84 yo f nv

## 2017-05-15 NOTE — ED Triage Notes (Signed)
[facility]. Nausea and vomiting started today at 1530. Orthostatic BP negative per EMS. Afebrile. Denies pain. BP 176/82, P 91, RR 20, 97% sat on room air. Finger stick glucose 191.

## 2017-05-16 ENCOUNTER — Inpatient Hospital Stay (HOSPITAL_COMMUNITY): Payer: Medicare Other

## 2017-05-16 ENCOUNTER — Encounter (HOSPITAL_COMMUNITY): Payer: Self-pay | Admitting: Internal Medicine

## 2017-05-16 DIAGNOSIS — E860 Dehydration: Secondary | ICD-10-CM | POA: Diagnosis present

## 2017-05-16 DIAGNOSIS — N63 Unspecified lump in unspecified breast: Secondary | ICD-10-CM | POA: Diagnosis not present

## 2017-05-16 DIAGNOSIS — I10 Essential (primary) hypertension: Secondary | ICD-10-CM | POA: Diagnosis not present

## 2017-05-16 DIAGNOSIS — N838 Other noninflammatory disorders of ovary, fallopian tube and broad ligament: Secondary | ICD-10-CM | POA: Diagnosis present

## 2017-05-16 DIAGNOSIS — K8020 Calculus of gallbladder without cholecystitis without obstruction: Secondary | ICD-10-CM | POA: Insufficient documentation

## 2017-05-16 DIAGNOSIS — Z66 Do not resuscitate: Secondary | ICD-10-CM | POA: Diagnosis not present

## 2017-05-16 DIAGNOSIS — R112 Nausea with vomiting, unspecified: Secondary | ICD-10-CM | POA: Diagnosis not present

## 2017-05-16 DIAGNOSIS — N839 Noninflammatory disorder of ovary, fallopian tube and broad ligament, unspecified: Secondary | ICD-10-CM | POA: Diagnosis not present

## 2017-05-16 LAB — COMPREHENSIVE METABOLIC PANEL
ALK PHOS: 53 U/L (ref 38–126)
ALT: 13 U/L — ABNORMAL LOW (ref 14–54)
AST: 23 U/L (ref 15–41)
Albumin: 3.6 g/dL (ref 3.5–5.0)
Anion gap: 9 (ref 5–15)
BUN: 14 mg/dL (ref 6–20)
CALCIUM: 9 mg/dL (ref 8.9–10.3)
CO2: 21 mmol/L — AB (ref 22–32)
Chloride: 109 mmol/L (ref 101–111)
Creatinine, Ser: 1.08 mg/dL — ABNORMAL HIGH (ref 0.44–1.00)
GFR, EST AFRICAN AMERICAN: 50 mL/min — AB (ref >60)
GFR, EST NON AFRICAN AMERICAN: 43 mL/min — AB (ref >60)
Glucose, Bld: 98 mg/dL (ref 65–99)
Potassium: 4 mmol/L (ref 3.5–5.1)
SODIUM: 139 mmol/L (ref 135–145)
Total Bilirubin: 0.8 mg/dL (ref 0.3–1.2)
Total Protein: 6.6 g/dL (ref 6.5–8.1)

## 2017-05-16 LAB — HEMOGLOBIN A1C
HEMOGLOBIN A1C: 4.6 % — AB (ref 4.8–5.6)
Mean Plasma Glucose: 85.32 mg/dL

## 2017-05-16 LAB — CBC
HCT: 37.1 % (ref 36.0–46.0)
HEMOGLOBIN: 11.6 g/dL — AB (ref 12.0–15.0)
MCH: 28 pg (ref 26.0–34.0)
MCHC: 31.3 g/dL (ref 30.0–36.0)
MCV: 89.4 fL (ref 78.0–100.0)
PLATELETS: 274 10*3/uL (ref 150–400)
RBC: 4.15 MIL/uL (ref 3.87–5.11)
RDW: 13.1 % (ref 11.5–15.5)
WBC: 11.2 10*3/uL — AB (ref 4.0–10.5)

## 2017-05-16 LAB — TROPONIN I
Troponin I: 0.04 ng/mL (ref <0.03)
Troponin I: 0.04 ng/mL (ref <0.03)

## 2017-05-16 LAB — MAGNESIUM: MAGNESIUM: 1.7 mg/dL (ref 1.7–2.4)

## 2017-05-16 LAB — MRSA PCR SCREENING: MRSA BY PCR: NEGATIVE

## 2017-05-16 LAB — PHOSPHORUS: Phosphorus: 2.4 mg/dL — ABNORMAL LOW (ref 2.5–4.6)

## 2017-05-16 LAB — TSH: TSH: 1.025 u[IU]/mL (ref 0.350–4.500)

## 2017-05-16 MED ORDER — ACETAMINOPHEN 325 MG PO TABS: 650.0000 mg | ORAL_TABLET | Freq: Four times a day (QID) | ORAL | Status: DC | PRN

## 2017-05-16 MED ORDER — ONDANSETRON HCL 4 MG PO TABS: 4.0000 mg | ORAL_TABLET | Freq: Four times a day (QID) | ORAL | Status: DC | PRN

## 2017-05-16 MED ORDER — GADOBENATE DIMEGLUMINE 529 MG/ML IV SOLN
15.0000 mL | Freq: Once | INTRAVENOUS | Status: AC | PRN
  Administered 2017-05-16: 14 mL via INTRAVENOUS

## 2017-05-16 MED ORDER — SODIUM CHLORIDE 0.9 % IV SOLN
INTRAVENOUS | Status: AC
  Administered 2017-05-16: 02:00:00 via INTRAVENOUS

## 2017-05-16 MED ORDER — AMLODIPINE BESYLATE 10 MG PO TABS
10.0000 mg | ORAL_TABLET | Freq: Every day | ORAL | Status: DC
  Administered 2017-05-16 – 2017-05-19 (×4): 10 mg via ORAL
  Filled 2017-05-16 (×4): qty 1

## 2017-05-16 MED ORDER — ONDANSETRON HCL 4 MG/2ML IJ SOLN: 4.0000 mg | Freq: Four times a day (QID) | INTRAMUSCULAR | Status: DC | PRN

## 2017-05-16 MED ORDER — LISINOPRIL 20 MG PO TABS
20.0000 mg | ORAL_TABLET | Freq: Every day | ORAL | Status: DC
  Administered 2017-05-16 – 2017-05-19 (×4): 20 mg via ORAL
  Filled 2017-05-16 (×4): qty 1

## 2017-05-16 MED ORDER — ACETAMINOPHEN 650 MG RE SUPP: 650.0000 mg | Freq: Four times a day (QID) | RECTAL | Status: DC | PRN

## 2017-05-16 MED ORDER — LATANOPROST 0.005 % OP SOLN
1.0000 [drp] | Freq: Every day | OPHTHALMIC | Status: DC
Start: 2017-05-16 — End: 2017-05-19
  Administered 2017-05-16 – 2017-05-18 (×3): 1 [drp] via OPHTHALMIC
  Filled 2017-05-16: qty 2.5

## 2017-05-16 MED ORDER — HYDROCODONE-ACETAMINOPHEN 5-325 MG PO TABS
1.0000 | ORAL_TABLET | ORAL | Status: DC | PRN
  Administered 2017-05-19: 2 via ORAL
  Filled 2017-05-16: qty 2

## 2017-05-16 NOTE — Plan of Care (Signed)
Problem: Education: Goal: Knowledge of General Education information will improve Outcome: Progressing   Problem: Elimination: Goal: Will not experience complications related to urinary retention Outcome: Progressing   Problem: Pain Managment: Goal: General experience of comfort will improve Outcome: Progressing   Problem: Safety: Goal: Ability to remain free from injury will improve Outcome: Progressing   

## 2017-05-16 NOTE — Progress Notes (Signed)
ED TO INPATIENT HANDOFF REPORT  Name/Age/Gender Ann Cameron 84 y.o. female  Code Status Advance Directive Documentation     Most Recent Value  Type of Advance Directive  Healthcare Power of Attorney  Pre-existing out of facility DNR order (yellow form or pink MOST form)  -  "MOST" Form in Place?  -      Home/SNF/Other Home  Chief Complaint Nausea, Vomiting  Level of Care/Admitting Diagnosis ED Disposition    ED Disposition Condition [place]: [facility] [[id number]]  Level of Care: Telemetry [5]  Admit to tele based on following criteria: Monitor for Ischemic changes  Diagnosis: Dehydration [276.51.ICD-9-CM]  Admitting Physician: Toy Baker [[id number]]  Attending Physician: Toy Baker [[id number]]  Estimated length of stay: 3 - 4 days  Certification:: I certify this patient will need inpatient services for at least 2 midnights  PT Class (Do Not Modify): Inpatient [101]  PT Acc Code (Do Not Modify): Private [1]       Medical History Past Medical History:  Diagnosis Date  . Arthritis   . HTN (hypertension)   . Hyperlipemia   . Osteopenia   . Ovarian mass     Allergies No Known Allergies  IV Location/Drains/Wounds Patient Lines/Drains/Airways Status   Active Line/Drains/Airways    Name:   Placement date:   Placement time:   Site:   Days:   Peripheral IV 05/15/17 Left Antecubital   05/15/17    1854    Antecubital   1          Labs/Imaging Results for orders placed or performed during the hospital encounter of 05/15/17 (from the past 48 hour(s))  CBC with Differential     Status: Abnormal   Collection Time: 05/15/17  7:42 PM  Result Value Ref Range   WBC 12.4 (H) 4.0 - 10.5 K/uL   RBC 4.38 3.87 - 5.11 MIL/uL   Hemoglobin 12.6 12.0 - 15.0 g/dL   HCT 39.0 36.0 - 46.0 %   MCV 89.0 78.0 - 100.0 fL   MCH 28.8 26.0 - 34.0 pg   MCHC 32.3 30.0 - 36.0 g/dL   RDW 12.9 11.5 - 15.5 %   Platelets 178 150 - 400  K/uL   Neutrophils Relative % 91 %   Neutro Abs 11.1 (H) 1.7 - 7.7 K/uL   Lymphocytes Relative 5 %   Lymphs Abs 0.6 (L) 0.7 - 4.0 K/uL   Monocytes Relative 4 %   Monocytes Absolute 0.5 0.1 - 1.0 K/uL   Eosinophils Relative 0 %   Eosinophils Absolute 0.1 0.0 - 0.7 K/uL   Basophils Relative 0 %   Basophils Absolute 0.0 0.0 - 0.1 K/uL    Comment: Performed at [facility], [place] [address] [place], [place] [address]  Comprehensive metabolic panel     Status: Abnormal   Collection Time: 05/15/17  7:42 PM  Result Value Ref Range   Sodium 142 135 - 145 mmol/L   Potassium 4.0 3.5 - 5.1 mmol/L   Chloride 107 101 - 111 mmol/L   CO2 23 22 - 32 mmol/L   Glucose, Bld 203 (H) 65 - 99 mg/dL   BUN 21 (H) 6 - 20 mg/dL   Creatinine, Ser 1.41 (H) 0.44 - 1.00 mg/dL   Calcium 9.7 8.9 - 10.3 mg/dL   Total Protein 7.6 6.5 - 8.1 g/dL   Albumin 4.1 3.5 - 5.0 g/dL   AST 25 15 - 41 U/L  ALT 15 14 - 54 U/L   Alkaline Phosphatase 60 38 - 126 U/L   Total Bilirubin 0.8 0.3 - 1.2 mg/dL   GFR calc non Af Amer 31 (L) >60 mL/min   GFR calc Af Amer 36 (L) >60 mL/min    Comment: (NOTE) The eGFR has been calculated using the CKD EPI equation. This calculation has not been validated in all clinical situations. eGFR's persistently <60 mL/min signify possible Chronic Kidney Disease.    Anion gap 12 5 - 15    Comment: Performed at [facility], [place] [address], [place], [place] [address]  Lipase, blood     Status: None   Collection Time: 05/15/17  7:42 PM  Result Value Ref Range   Lipase 27 11 - 51 U/L    Comment: Performed at [facility], [place] [address] [place], [place] [address]  I-Stat Troponin, ED (not at [facility])     Status: None   Collection Time: 05/15/17  8:43 PM  Result Value Ref Range   Troponin i, poc 0.00 0.00 - 0.08 ng/mL   Comment 3            Comment: Due to the release kinetics of cTnI, a negative result within the first hours of the  onset of symptoms does not rule out myocardial infarction with certainty. If myocardial infarction is still suspected, repeat the test at appropriate intervals.   Urinalysis, Routine w reflex microscopic     Status: Abnormal   Collection Time: 05/15/17 10:22 PM  Result Value Ref Range   Color, Urine YELLOW YELLOW   APPearance CLEAR CLEAR   Specific Gravity, Urine 1.014 1.005 - 1.030   pH 8.0 5.0 - 8.0   Glucose, UA NEGATIVE NEGATIVE mg/dL   Hgb urine dipstick NEGATIVE NEGATIVE   Bilirubin Urine NEGATIVE NEGATIVE   Ketones, ur 5 (A) NEGATIVE mg/dL   Protein, ur 30 (A) NEGATIVE mg/dL   Nitrite NEGATIVE NEGATIVE   Leukocytes, UA TRACE (A) NEGATIVE   RBC / HPF 0-5 0 - 5 RBC/hpf   WBC, UA 6-30 0 - 5 WBC/hpf   Bacteria, UA NONE SEEN NONE SEEN   Squamous Epithelial / LPF 0-5 (A) NONE SEEN   Mucus PRESENT     Comment: Performed at [facility], [place] [address], [place], [place] [address]  Troponin I (q 6hr x 3)     Status: None   Collection Time: 05/16/17 12:47 AM  Result Value Ref Range   Troponin I <0.03 <0.03 ng/mL    Comment: Performed at [facility], [place] [address], [place], [place] [address]   Ct Head Wo Contrast  Result Date: 05/16/2017 CLINICAL DATA:  Ataxia with nausea and vomiting. EXAM: CT HEAD WITHOUT CONTRAST TECHNIQUE: Contiguous axial images were obtained from the base of the skull through the vertex without intravenous contrast. COMPARISON:  None. FINDINGS: Brain: No mass lesion, intraparenchymal hemorrhage or extra-axial collection. No evidence of acute cortical infarct. Normal appearance of the brain parenchyma and extra axial spaces for age. Vascular: No hyperdense vessel or unexpected vascular calcification. Skull: Normal visualized skull base, calvarium and extracranial soft tissues. Sinuses/Orbits: Chronic right maxillary sinusitis.  Normal orbits. IMPRESSION: Normal aging brain. Electronically Signed   By: Ulyses Jarred M.D.   On:  05/16/2017 01:47   Ct Abdomen Pelvis W Contrast  Result Date: 05/15/2017 CLINICAL DATA:  Nausea and vomiting EXAM: CT ABDOMEN AND PELVIS WITH CONTRAST TECHNIQUE: Multidetector CT imaging of the abdomen  and pelvis was performed using the standard protocol following bolus administration of intravenous contrast. CONTRAST:  159m ISOVUE-300 IOPAMIDOL (ISOVUE-300) INJECTION 61% COMPARISON:  04/11/2013 FINDINGS: Lower chest: Lung bases demonstrate no acute consolidation or pleural effusion. Borderline cardiomegaly. Partially visualized 19 mm mass in the outer left breast. Mild esophageal hiatal hernia. Hepatobiliary: No focal hepatic abnormality. Calcified stones in the gallbladder. Possible mild gallbladder wall thickening or edema. No biliary dilatation Pancreas: Unremarkable. No pancreatic ductal dilatation or surrounding inflammatory changes. Spleen: Normal in size without focal abnormality. Adrenals/Urinary Tract: Adrenal glands are within normal limits. Indeterminate hypodense lesion upper pole left kidney measuring 1 cm. Subcentimeter hypodense renal lesions too small to further characterize. No hydronephrosis. Distended bladder. Stomach/Bowel: Stomach nonenlarged. No dilated small bowel. Diffuse diverticular disease of the colon without acute inflammation. Vascular/Lymphatic: Moderate aortic atherosclerosis. No aneurysmal dilatation. No significantly enlarged lymph nodes. Reproductive: Interval increase in size of a large complex cystic mass filling the abdomen and pelvis, now measuring approximately 26 cm transverse by 15.5 cm AP by 17.7 cm craniocaudad Other: Negative for free air or significant free fluid. Musculoskeletal: Stable sclerotic lesion in L3. Grade 1 anterolisthesis of L4 on L5. Degenerative changes at L5-S1. IMPRESSION: 1. Interval increase in size of a massive complex cystic and solid mass with calcifications, now measuring up to 26 cm, concerning for ovarian neoplasm. 2. Gallstones. Possible  wall thickening or gallbladder wall edema. Correlation with right upper quadrant abdominal ultrasound suggested. 3. Diffuse diverticular disease of the colon without acute inflammation. 4. Partially visualized 19 mm slightly dense mass in the outer left breast. Correlation with outpatient mammography and ultrasound if felt clinically indicated. Electronically Signed   By: KDonavan FoilM.D.   On: 05/15/2017 23:09    Pending Labs Unresulted Labs (From admission, onward)   Start     Ordered   05/16/17 0033  Troponin I (q 6hr x 3)  Now then every 6 hours,   R     05/16/17 0032   05/15/17 2147  Urine culture  STAT,   STAT     05/15/17 2146   Signed and Held  Magnesium  Tomorrow morning,   R    Comments:  Call MD if <1.5    Signed and Held   Signed and Held  Phosphorus  Tomorrow morning,   R     Signed and Held   Signed and Held  TSH  Once,   R    Comments:  Cancel if already done within 1 month and notify MD    Signed and Held   Signed and Held  Comprehensive metabolic panel  Once,   R    Comments:  Cal MD for K<3.5 or >5.0    Signed and Held   Signed and Held  CBC  Once,   R    Comments:  Call for hg <8.0    Signed and Held   Signed and Held  Hemoglobin A1c  Tomorrow morning,   R    Comments:  Cancel if has been done within past month and notify MD    Signed and Held      Vitals/Pain Today's Vitals   05/15/17 1852 05/15/17 2000 05/15/17 2100 05/15/17 2316  BP:  (!) 152/62 (!) 154/60 (!) 155/60  Pulse:  80 88 85  Resp:   14 17  Temp:      TempSrc:      SpO2:  96% 93% 95%  Weight: 149 lb (67.6 kg)  Height: 5' 1"  (1.549 m)     PainSc:        Isolation Precautions No active isolations  Medications Medications  iopamidol (ISOVUE-300) 61 % injection (has no administration in time range)  sodium chloride 0.9 % injection (has no administration in time range)  ondansetron (ZOFRAN) injection 4 mg (4 mg Intravenous Given 05/15/17 1933)  iopamidol (ISOVUE-300) 61 %  injection 80 mL (100 mLs Intravenous Contrast Given 05/15/17 2222)  ondansetron (ZOFRAN) injection 4 mg (4 mg Intravenous Given 05/15/17 2245)  sodium chloride 0.9 % bolus 1,000 mL (0 mLs Intravenous Stopped 05/16/17 0143)    Mobility walks

## 2017-05-16 NOTE — Progress Notes (Signed)
Attempted to notify [facility] of patient admission.

## 2017-05-16 NOTE — Evaluation (Signed)
Occupational Therapy Evaluation Patient Details Name: Ann Cameron MRN: [id number] DOB: Feb 18, 1922 Today's Date: 05/16/2017    History of Present Illness pt was admitted for nausea and vomiting.  Ovarian mass present.  PMH:  breast CA, HTN, gout   Clinical Impression   This 84 year old female was admitted for the above.  [facility].  She currently needs min A overall for steadying assistance. Will follow in acute setting with supervision level goals.    Follow Up Recommendations  Home health OT;SNF(vs depending on progress)    Equipment Recommendations  None recommended by OT    Recommendations for Other Services       Precautions / Restrictions Precautions Precautions: Fall Restrictions Weight Bearing Restrictions: No      Mobility Bed Mobility Overal bed mobility: Modified Independent             General bed mobility comments: HOB raised  Transfers Overall transfer level: Needs assistance Equipment used: None Transfers: Sit to/from Stand Sit to Stand: Min assist;Min guard         General transfer comment: light steadying assistance when UE not supported    Balance                                           ADL either performed or assessed with clinical judgement   ADL Overall ADL's : Needs assistance/impaired Eating/Feeding: NPO;Independent   Grooming: Oral care;Supervision/safety;Wash/dry hands;Wash/dry face;Standing       Lower Body Bathing: Minimal assistance;Sit to/from stand       Lower Body Dressing: Minimal assistance;Sit to/from stand   Toilet Transfer: Minimal assistance;Ambulation;Comfort height toilet;RW   Toileting- ArchitectClothing Manipulation and Hygiene: Min guard;Sit to/from stand(with grab bar)         General ADL Comments: min A for steadying during adls/ambulating to bathoom     Vision         Perception     Praxis      Pertinent Vitals/Pain Pain Assessment:  No/denies pain     Hand Dominance     Extremity/Trunk Assessment Upper Extremity Assessment Upper Extremity Assessment: Overall WFL for tasks assessed           Communication Communication Communication: No difficulties   Cognition Arousal/Alertness: Awake/alert Behavior During Therapy: WFL for tasks assessed/performed Overall Cognitive Status: Within Functional Limits for tasks assessed                                     General Comments       Exercises     Shoulder Instructions      Home Living Family/patient expects to be discharged to:: Assisted living                                 [facility]      Prior Functioning/Environment Level of Independence: Independent                 OT Problem List: Decreased strength;Impaired balance (sitting and/or standing);Decreased knowledge of use of DME or AE;Decreased activity tolerance      OT Treatment/Interventions: Self-care/ADL training;Energy conservation;DME and/or AE instruction;Patient/family education;Balance training    OT Goals(Current goals can be found in  the care plan section) Acute Rehab OT Goals Patient Stated Goal: none stated; agreeable to OOB OT Goal Formulation: With patient Time For Goal Achievement: 05/30/17 Potential to Achieve Goals: Good ADL Goals Pt Will Transfer to Toilet: with supervision;ambulating;regular height toilet Additional ADL Goal #1: pt will gather clothes and complete adl at supervision level, initiating at least one rest break for energy conservation  OT Frequency: Min 2X/week   Barriers to D/C:            Co-evaluation              AM-PAC PT "6 Clicks" Daily Activity     Outcome Measure Help from another person eating meals?: None Help from another person taking care of personal grooming?: A Little Help from another person toileting, which includes using toliet, bedpan, or urinal?: A Little Help from  another person bathing (including washing, rinsing, drying)?: A Little Help from another person to put on and taking off regular upper body clothing?: A Little Help from another person to put on and taking off regular lower body clothing?: A Little 6 Click Score: 19   End of Session    Activity Tolerance: Patient tolerated treatment well Patient left: in chair;with call bell/phone within reach;with chair alarm set  OT Visit Diagnosis: Unsteadiness on feet (R26.81)                Time: 1610-9604 OT Time Calculation (min): 18 min Charges:  OT General Charges $OT Visit: 1 Visit OT Evaluation $OT Eval Low Complexity: 1 Low G-Codes:     [place], OTR/L [phone] 05/16/2017  Tiny Chaudhary 05/16/2017, 10:39 AM

## 2017-05-16 NOTE — Progress Notes (Signed)
[facility] PROGRESS NOTE    Progress Note  Ann Cameron  ZOX:[id number] DOB: April 12, 1922 DOA: 05/15/2017 PCP: Renford Dills, MD     Brief Narrative:   Ann Cameron is an 84 y.o. female past medical history of a pelvic mass, hypertension osteoporosis, breast cancer status post radiation and lumpectomy, chronic kidney disease with a baseline creatinine 1.4, comes in as she started vomiting at home, she related did episodes of lightheadedness prior to her episode of nausea and vomiting  Assessment/Plan:   Nausea and vomiting: With vertigo and ataxia on admission as per admitting MD. CT scan of the head showed no acute findings, She had no events on telemetry, cardiac biomarkers have remained negative. She has had no further nausea, she relates she is hungry would like to try clear liquid diet. We will give her Mylanta for reflux as needed. Scan of the abdomen and pelvis showed no obstruction from the ovarian mass.  Acute kidney injury: Likely prerenal in etiology, improving with IV fluid hydration.  Ovarian mass Patient refused surgical intervention, palliative care has been consulted.  Cholelithiasis Ultrasound showedsigns of acute cholecystitis he has remained afebrile. Her leukocytosis is improving, she has remained afebrile. LFTs are not elevated.  Essential HTN (hypertension) Continue current home medications.  History of breast mass: She has been told about a year ago about her breast mass, no further interventions at this point.  Dehydration He was given IV fluids resolved.    DVT prophylaxis: lovenxo Family Communication:none Disposition Plan/Barrier to D/C: home in am Code Status:     Code Status Orders  (From admission, onward)        Start     Ordered   05/16/17 0219  Do not attempt resuscitation (DNR)  Continuous    Question Answer Comment  In the event of cardiac or respiratory ARREST Do not call a "code blue"   In the event of  cardiac or respiratory ARREST Do not perform Intubation, CPR, defibrillation or ACLS   In the event of cardiac or respiratory ARREST Use medication by any route, position, wound care, and other measures to relive pain and suffering. May use oxygen, suction and manual treatment of airway obstruction as needed for comfort.      05/16/17 0218    Code Status History    This patient has a current code status but no historical code status.    Advance Directive Documentation     Most Recent Value  Type of Advance Directive  Healthcare Power of Attorney  Pre-existing out of facility DNR order (yellow form or pink MOST form)  -  "MOST" Form in Place?  -        IV Access:    Peripheral IV   Procedures and diagnostic studies:   Ct Head Wo Contrast  Result Date: 05/16/2017 CLINICAL DATA:  Ataxia with nausea and vomiting. EXAM: CT HEAD WITHOUT CONTRAST TECHNIQUE: Contiguous axial images were obtained from the base of the skull through the vertex without intravenous contrast. COMPARISON:  None. FINDINGS: Brain: No mass lesion, intraparenchymal hemorrhage or extra-axial collection. No evidence of acute cortical infarct. Normal appearance of the brain parenchyma and extra axial spaces for age. Vascular: No hyperdense vessel or unexpected vascular calcification. Skull: Normal visualized skull base, calvarium and extracranial soft tissues. Sinuses/Orbits: Chronic right maxillary sinusitis.  Normal orbits. IMPRESSION: Normal aging brain. Electronically Signed   By: Deatra Robinson M.D.   On: 05/16/2017 01:47   Ct Abdomen Pelvis W Contrast  Result  Date: 05/15/2017 CLINICAL DATA:  Nausea and vomiting EXAM: CT ABDOMEN AND PELVIS WITH CONTRAST TECHNIQUE: Multidetector CT imaging of the abdomen and pelvis was performed using the standard protocol following bolus administration of intravenous contrast. CONTRAST:  ISOVUE-300 IOPAMIDOL (ISOVUE-300) INJECTION 61% COMPARISON:  04/11/2013 FINDINGS: Lower  chest: Lung bases demonstrate no acute consolidation or pleural effusion. Borderline cardiomegaly. Partially visualized 19 mm mass in the outer left breast. Mild esophageal hiatal hernia. Hepatobiliary: No focal hepatic abnormality. Calcified stones in the gallbladder. Possible mild gallbladder wall thickening or edema. No biliary dilatation Pancreas: Unremarkable. No pancreatic ductal dilatation or surrounding inflammatory changes. Spleen: Normal in size without focal abnormality. Adrenals/Urinary Tract: Adrenal glands are within normal limits. Indeterminate hypodense lesion upper pole left kidney measuring 1 cm. Subcentimeter hypodense renal lesions too small to further characterize. No hydronephrosis. Distended bladder. Stomach/Bowel: Stomach nonenlarged. No dilated small bowel. Diffuse diverticular disease of the colon without acute inflammation. Vascular/Lymphatic: Moderate aortic atherosclerosis. No aneurysmal dilatation. No significantly enlarged lymph nodes. Reproductive: Interval increase in size of a large complex cystic mass filling the abdomen and pelvis, now measuring approximately 26 cm transverse by 15.5 cm AP by 17.7 cm craniocaudad Other: Negative for free air or significant free fluid. Musculoskeletal: Stable sclerotic lesion in L3. Grade 1 anterolisthesis of L4 on L5. Degenerative changes at L5-S1. IMPRESSION: 1. Interval increase in size of a massive complex cystic and solid mass with calcifications, now measuring up to 26 cm, concerning for ovarian neoplasm. 2. Gallstones. Possible wall thickening or gallbladder wall edema. Correlation with right upper quadrant abdominal ultrasound suggested. 3. Diffuse diverticular disease of the colon without acute inflammation. 4. Partially visualized 19 mm slightly dense mass in the outer left breast. Correlation with outpatient mammography and ultrasound if felt clinically indicated. Electronically Signed   By: Jasmine Pang M.D.   On: 05/15/2017 23:09    US Abdomen Limited Ruq  Result Date: 05/16/2017 CLINICAL DATA:  Cholelithiasis and vomiting EXAM: ULTRASOUND ABDOMEN LIMITED RIGHT UPPER QUADRANT COMPARISON:  CT abdomen pelvis 05/15/2017 FINDINGS: Gallbladder: There is a large stone in the gallbladder measuring up to 3.1 cm. Negative sonographic Murphy sign. No wall thickening or pericholecystic fluid. Common bile duct: Diameter: 1.5 mm, normal. Liver: No focal lesion identified. Within normal limits in parenchymal echogenicity. Portal vein is patent on color Doppler imaging with normal direction of blood flow towards the liver. IMPRESSION: Cholelithiasis without other evidence of acute cholecystitis. Electronically Signed   By: Deatra Robinson M.D.   On: 05/16/2017 01:55     Medical Consultants:    None.  Anti-Infectives:   None  Subjective:    Ann Cameron relates feels much better compared to yesterday.  She is hungry.  Objective:    Vitals:   05/15/17 2316 05/16/17 0233 05/16/17 0233 05/16/17 0513  BP: (!) 155/60  (!) 180/68 (!) 166/74  Pulse: 85  94 79  Resp: 17  20 18   Temp:   98.2 F (36.8 C) 97.8 F (36.6 C)  TempSrc:   Oral Oral  SpO2: 95%  98% 100%  Weight:  68.7 kg (151 lb 8 oz)    Height:  5' (1.524 m)      Intake/Output Summary (Last 24 hours) at 05/16/2017 1023 Last data filed at 05/16/2017 0600 Gross per 24 hour  Intake 1271.24 ml  Output 500 ml  Net 771.24 ml   Filed Weights   05/15/17 1852 05/16/17 0233  Weight: 67.6 kg (149 lb) 68.7 kg (151 lb 8 oz)  Exam: General exam: In no acute distress. Respiratory system: Good air movement and clear to auscultation. Cardiovascular system: S1 & S2 heard, RRR. No JVD, murmurs, rubs, gallops or clicks.  Gastrointestinal system: Abdomen is nondistended, soft and nontender.  Central nervous system: Alert and oriented. No focal neurological deficits. Extremities: No pedal edema. Skin: No rashes, lesions or ulcers Psychiatry: Judgement and insight  appear normal. Mood & affect appropriate.    Data Reviewed:    Labs: Basic Metabolic Panel: Recent Labs  Lab 05/15/17 1942 05/16/17 0733  NA 142 139  K 4.0 4.0  CL 107 109  CO2 23 21*  GLUCOSE 203* 98  BUN 21* 14  CREATININE 1.41* 1.08*  CALCIUM 9.7 9.0  MG  --  1.7  PHOS  --  2.4*   GFR Estimated Creatinine Clearance: 28.8 mL/min (A) (by C-G formula based on SCr of 1.08 mg/dL (H)). Liver Function Tests: Recent Labs  Lab 05/15/17 1942 05/16/17 0733  AST 25 23  ALT 15 13*  ALKPHOS 60 53  BILITOT 0.8 0.8  PROT 7.6 6.6  ALBUMIN 4.1 3.6   Recent Labs  Lab 05/15/17 1942  LIPASE 27   No results for input(s): AMMONIA in the last 168 hours. Coagulation profile No results for input(s): INR, PROTIME in the last 168 hours.  CBC: Recent Labs  Lab 05/15/17 1942 05/16/17 0733  WBC 12.4* 11.2*  NEUTROABS 11.1*  --   HGB 12.6 11.6*  HCT 39.0 37.1  MCV 89.0 89.4  PLT 178 274   Cardiac Enzymes: Recent Labs  Lab 05/16/17 0047 05/16/17 0733  TROPONINI <0.03 0.04*   BNP (last 3 results) No results for input(s): PROBNP in the last 8760 hours. CBG: No results for input(s): GLUCAP in the last 168 hours. D-Dimer: No results for input(s): DDIMER in the last 72 hours. Hgb A1c: Recent Labs    05/16/17 0733  HGBA1C 4.6*   Lipid Profile: No results for input(s): CHOL, HDL, LDLCALC, TRIG, CHOLHDL, LDLDIRECT in the last 72 hours. Thyroid function studies: Recent Labs    05/16/17 0733  TSH 1.025   Anemia work up: No results for input(s): VITAMINB12, FOLATE, FERRITIN, TIBC, IRON, RETICCTPCT in the last 72 hours. Sepsis Labs: Recent Labs  Lab 05/15/17 1942 05/16/17 0733  WBC 12.4* 11.2*   Microbiology Recent Results (from the past 240 hour(s))  MRSA PCR Screening     Status: None   Collection Time: 05/16/17  2:34 AM  Result Value Ref Range Status   MRSA by PCR NEGATIVE NEGATIVE Final    Comment:        The GeneXpert MRSA Assay (FDA approved for NASAL  specimens only), is one component of a comprehensive MRSA colonization surveillance program. It is not intended to diagnose MRSA infection nor to guide or monitor treatment for MRSA infections. Performed at [facility], [address] [place], [place] [address]      Medications:   . amLODipine  10 mg Oral Daily  . latanoprost  1 drop Both Eyes QHS  . lisinopril  20 mg Oral Daily   Continuous Infusions: . sodium chloride 75 mL/hr at 05/16/17 0223      LOS: 0 days   Marinda Elkbraham Feliz Ortiz  [facility] Pager [phone]  *Please refer to [email or web address], password [email or web address] to get updated schedule on who will round on this patient, as hospitalists switch teams weekly. If 7PM-7AM, please contact night-coverage at [email or web address], password [email or web address] for any overnight needs.  05/16/2017, 10:23 AM

## 2017-05-16 NOTE — Clinical Social Work Note (Signed)
Clinical Social Work Assessment  Patient Details  Name: Ann Cameron MRN: [id number] Date of Birth: 1923-01-09  Date of referral:  05/16/17               Reason for consult:  Discharge Planning(admitted from facility)                Permission sought to share information with:  Oceanographeracility Contact Representative Permission granted to share information::  Yes, Verbal Permission Granted  Name::        Agency::  [facility]  Relationship::     Contact Information:     Housing/Transportation Living arrangements for the past 2 months:  [facility] Source of Information:  Patient Patient Interpreter Needed:  None Criminal Activity/Legal Involvement Pertinent to Current Situation/Hospitalization:  No - Comment as needed Significant Relationships:  Friend, [employer] Support Lives with:  Self, Facility Resident Do you feel safe going back to the place where you live?  Yes Need for family participation in patient care:  No (Coment)  Care giving concerns:  [facility] since [facility]. No caregiving concerns reported. No family, has support of friends. Independent at baseline. Currently admitted for nausea/vomiting, cholecystitis, ovarian and breast mass (hx of breast Ca). States she feels she will not want work-up/intervention for ovarian and breast mass, wants to discuss with palliative team.    Social Worker assessment / plan:  CSW consulted to assist with disposition as pt [facility]- reports she lives in independent living apartment there x 8 years. Pt reports she is open to returning to the skilled nursing section of [facility] if needed at [place]- awaiting consultations and therapy evaluations to determine course of care needed. CSW will contact facility for updates at pt's request.  Plan: [facility]- either to her independent living apartment or to skilled  section if needed.  Employment status:  Retired Theme park managernsurance information:  Managed Medicare([employer] Medicare) PT Recommendations:  Not assessed at this time(pending) Information / Referral to community resources:  Skilled Nursing Facility  Patient/Family's Response to care:  Pt very engaged and appreciative  Patient/Family's Understanding of and Emotional Response to Diagnosis, Current Treatment, and Prognosis:  Pt shows very good understanding of her treatment and plan- describing course very eloquently. Pt emotionally seemed very well-adjusted, she described her life to CSW very positively.   Emotional Assessment Appearance:  Appears younger than stated age Attitude/Demeanor/Rapport:  Engaged, Gracious Affect (typically observed):  Accepting, Adaptable, Pleasant, Calm Orientation:  Oriented to Self, Oriented to Place, Oriented to  Time, Oriented to Situation Alcohol / Substance use:  Not Applicable Psych involvement (Current and /or in the community):  No (Comment)  Discharge Needs  Concerns to be addressed:  Care Coordination, Decision making concerns, Discharge Planning Concerns Readmission within the last 30 days:  No Current discharge risk:  (still assessing) Barriers to Discharge:  Continued Medical Work up   [employer]rex CorporationMeghan R Avraham Benish, LCSW 05/16/2017, 11:40 AM  [phone]

## 2017-05-17 ENCOUNTER — Inpatient Hospital Stay (HOSPITAL_COMMUNITY): Payer: Medicare Other

## 2017-05-17 DIAGNOSIS — N63 Unspecified lump in unspecified breast: Secondary | ICD-10-CM | POA: Diagnosis not present

## 2017-05-17 DIAGNOSIS — E860 Dehydration: Secondary | ICD-10-CM | POA: Diagnosis not present

## 2017-05-17 DIAGNOSIS — R112 Nausea with vomiting, unspecified: Secondary | ICD-10-CM

## 2017-05-17 DIAGNOSIS — N839 Noninflammatory disorder of ovary, fallopian tube and broad ligament, unspecified: Secondary | ICD-10-CM

## 2017-05-17 DIAGNOSIS — I10 Essential (primary) hypertension: Secondary | ICD-10-CM | POA: Diagnosis not present

## 2017-05-17 DIAGNOSIS — Z515 Encounter for palliative care: Secondary | ICD-10-CM

## 2017-05-17 DIAGNOSIS — Z7189 Other specified counseling: Secondary | ICD-10-CM | POA: Diagnosis not present

## 2017-05-17 DIAGNOSIS — N179 Acute kidney failure, unspecified: Secondary | ICD-10-CM | POA: Diagnosis not present

## 2017-05-17 DIAGNOSIS — K8020 Calculus of gallbladder without cholecystitis without obstruction: Secondary | ICD-10-CM | POA: Diagnosis not present

## 2017-05-17 LAB — URINE CULTURE

## 2017-05-17 MED ORDER — METOCLOPRAMIDE HCL 5 MG PO TABS
5.0000 mg | ORAL_TABLET | Freq: Three times a day (TID) | ORAL | Status: DC
  Administered 2017-05-18 – 2017-05-19 (×4): 5 mg via ORAL
  Filled 2017-05-17 (×5): qty 1

## 2017-05-17 NOTE — Plan of Care (Signed)
  Problem: Education: Goal: Knowledge of General Education information will improve Outcome: Progressing   Problem: Clinical Measurements: Goal: Ability to maintain clinical measurements within normal limits will improve Outcome: Progressing Goal: Will remain free from infection Outcome: Progressing Goal: Diagnostic test results will improve Outcome: Progressing Goal: Respiratory complications will improve Outcome: Progressing Goal: Cardiovascular complication will be avoided Outcome: Progressing   Problem: Activity: Goal: Risk for activity intolerance will decrease Outcome: Progressing   Problem: Nutrition: Goal: Adequate nutrition will be maintained Outcome: Progressing   Problem: Coping: Goal: Level of anxiety will decrease Outcome: Progressing   Problem: Elimination: Goal: Will not experience complications related to bowel motility Outcome: Progressing Goal: Will not experience complications related to urinary retention Outcome: Progressing   Problem: Pain Managment: Goal: General experience of comfort will improve Outcome: Progressing   Problem: Safety: Goal: Ability to remain free from injury will improve Outcome: Progressing   Problem: Skin Integrity: Goal: Risk for impaired skin integrity will decrease Outcome: Progressing   

## 2017-05-17 NOTE — Progress Notes (Addendum)
PROGRESS NOTE    Ann ReapJulia M Rumbold  WGN:[id number] DOB: May 03, 1922 DOA: 05/15/2017 PCP: Renford DillsPolite, Ronald, MD    Brief Narrative:  84 year old female who presented with nausea and vomiting.  She does have a significant past medical history for known pelvic mass, hypertension, dyslipidemia, osteoporosis, history of breast cancer status post radiation and lumpectomy, gout and chronic kidney disease.  Patient complain of persistent nausea and postprandial vomiting, associated with  imbalance and generalized weakness.  On initial physical examination blood pressure 175/69, heart rate 95, respiratory 20, temperature is 97.8, oxygen saturation 97%.  Dry mucous membranes, lungs clear to auscultation, heart S1 suppressive rhythmic, the abdomen was soft, distended, non-tender, no lower extremity edema.  CT of the pelvis with interval increase in size of a massive complex cyst solid mass with calcification, now measured 26 cm, concerning for ovarian neoplasm.  Positive gallstones,  Patient was admitted to the hospital with a working diagnosis of nausea and vomiting related to worsening pelvic mass.    Assessment & Plan:   Active Problems:   Ovarian mass   Cholelithiasis   HTN (hypertension)   Nausea and vomiting   Breast mass in female   Dehydration   1. Intractable nausea and vomiting due to large pelvic mass. No further nausea or vomiting, will continue to advance diet as tolerated with full liquids, continue as needed antiemetics. Added metoclopramide tid with meals.   2. Cholelithiasis. No clinical signs of cholecystitis, will continue close monitoring, advance diet as tolerated.   3. Large worsening pelvic mass. Will continue conservative management, palliative care consultation. Case discusses with Dr. Neale BurlyFreeman, patient may qualify for hospice services.   4. HTN. Will continue blood pressure control with amlodipine and lisinopril, keep systolic less than 180 mmHg systolic in the hospital.   5.  AKI. Improved renal function with serum cr down to 1.08 from 1,41, will follow on renal panel in am.    DVT prophylaxis: enoxaparin   Code Status:  dnr Family Communication: no family at the bedside 3 Disposition Plan:  home   Consultants:   Palliative care   Procedures:     Antimicrobials:       Subjective: Patient is feeling better, nausea has improved, not back to baseline, tolerating well clear liquids.   Objective: Vitals:   05/16/17 2046 05/16/17 2207 05/17/17 0515 05/17/17 1229  BP: (!) 175/77 (!) 155/68 (!) 164/60 (!) 173/69  Pulse: 75 72 70 83  Resp: 18  17 18   Temp: 97.9 F (36.6 C)  98.3 F (36.8 C) 98.5 F (36.9 C)  TempSrc: Oral  Oral Oral  SpO2: 99%  96% 99%  Weight:      Height:        Intake/Output Summary (Last 24 hours) at 05/17/2017 1650 Last data filed at 05/17/2017 1000 Gross per 24 hour  Intake 240 ml  Output -  Net 240 ml   Filed Weights   05/15/17 1852 05/16/17 0233  Weight: 67.6 kg (149 lb) 68.7 kg (151 lb 8 oz)    Examination:   General: Not in pain or dyspnea, deconditioned Neurology: Awake and alert, non focal  E ENT: mild pallor, no icterus, oral mucosa moist Cardiovascular: No JVD. S1-S2 present, rhythmic, no gallops, rubs, or murmurs. Trace lower extremity edema. Pulmonary: vesicular breath sounds bilaterally, adequate air movement, no wheezing, rhonchi or rales. Gastrointestinal. Abdomen distended, no organomegaly, non tender, no rebound or guarding Skin. No rashes Musculoskeletal: no joint deformities     Data Reviewed: I  have personally reviewed following labs and imaging studies  CBC: Recent Labs  Lab 05/15/17 1942 05/16/17 0733  WBC 12.4* 11.2*  NEUTROABS 11.1*  --   HGB 12.6 11.6*  HCT 39.0 37.1  MCV 89.0 89.4  PLT 178 274   Basic Metabolic Panel: Recent Labs  Lab 05/15/17 1942 05/16/17 0733  NA 142 139  K 4.0 4.0  CL 107 109  CO2 23 21*  GLUCOSE 203* 98  BUN 21* 14  CREATININE 1.41* 1.08*    CALCIUM 9.7 9.0  MG  --  1.7  PHOS  --  2.4*   GFR: Estimated Creatinine Clearance: 28.8 mL/min (A) (by C-G formula based on SCr of 1.08 mg/dL (H)). Liver Function Tests: Recent Labs  Lab 05/15/17 1942 05/16/17 0733  AST 25 23  ALT 15 13*  ALKPHOS 60 53  BILITOT 0.8 0.8  PROT 7.6 6.6  ALBUMIN 4.1 3.6   Recent Labs  Lab 05/15/17 1942  LIPASE 27   No results for input(s): AMMONIA in the last 168 hours. Coagulation Profile: No results for input(s): INR, PROTIME in the last 168 hours. Cardiac Enzymes: Recent Labs  Lab 05/16/17 0047 05/16/17 0733 05/16/17 1219  TROPONINI <0.03 0.04* 0.04*   BNP (last 3 results) No results for input(s): PROBNP in the last 8760 hours. HbA1C: Recent Labs    05/16/17 0733  HGBA1C 4.6*   CBG: No results for input(s): GLUCAP in the last 168 hours. Lipid Profile: No results for input(s): CHOL, HDL, LDLCALC, TRIG, CHOLHDL, LDLDIRECT in the last 72 hours. Thyroid Function Tests: Recent Labs    05/16/17 0733  TSH 1.025   Anemia Panel: No results for input(s): VITAMINB12, FOLATE, FERRITIN, TIBC, IRON, RETICCTPCT in the last 72 hours.    Radiology Studies: I have reviewed all of the imaging during this hospital visit personally     Scheduled Meds: . amLODipine  10 mg Oral Daily  . latanoprost  1 drop Both Eyes QHS  . lisinopril  20 mg Oral Daily  . metoCLOPramide  5 mg Oral TID AC   Continuous Infusions:   LOS: 1 day        Tkeya Stencil Annett Gula, MD [facility] Pager [phone]

## 2017-05-17 NOTE — Consult Note (Signed)
Consultation Note Date: 05/17/2017   Patient Name: Ann Cameron  DOB: 08-05-1922  MRN: [id number]  Age / Sex: 84 y.o., female  PCP: Ann Carol, MD Referring Physician: Tawni Cameron  Reason for Consultation: Establishing goals of care and Non pain symptom management  HPI/Patient Profile: 84 y.o. female  with past medical history of colic mass, hypertension, osteoporosis, breast cancer status post radiation and lumpectomy, chronic kidney disease admitted on 05/15/2017 with lightheadedness related to new onset nausea and vomiting.  She has subsequently been found to have increase in size of known ovarian mass as well as a breast mass.  Palliative consulted for goals of care.  Clinical Assessment and Goals of Care: I met today with Ann Cameron.  I introduced palliative care as specialized medical care for people living with serious illness. It focuses on providing relief from the symptoms and stress of a serious illness. The goal is to improve quality of life for both the patient and the family.  She reports to me that the most important thing to her is continuing to live life as well as she can.  She reports that she has a nephew who is her healthcare power of attorney and she is [facility] where she is lived for the past 8 years.  She is very happy with her life there and states that she has lived a good life and knows that she has an ovarian mass this is going to continue to progress moving forward.  She has been trying to do her best to preplan for the fact she is going to continue to decline.  She states that she hope she continues to live as well as possible and "goes quickly" when the time comes.  We discussed continued decline she has noted in her nutrition as well as functional status.  Overall, she remains very independent and able to perform her ADLs.  At the same  time, she reports that she has been noticing things are "slowing down" over the last several months.  She also reports that her appetite has been down and she feels bloated most of the time.  She is still able to force in some nutrition, however, she states that this is something that continues to decline over the past several weeks to months.  Up until last year, she was taking care of her sister who is also ill.  She thinks that when her sister died that "things just took a turn for the worse."  SUMMARY OF RECOMMENDATIONS   -DNR/DNI -Fullness/nausea/anorexia: Likely related to ovarian mass.  We discussed trial of Reglan and she would like to see if this is beneficial.  I started Reglan 5 mg 3 times daily prior to meals. -We discussed plans for when she leaves the hospital.  Her goal is to continue to live as well as possible and remain at [facility].  We discussed options for discharge home with palliative care and continuing to follow and see how she progresses over the next  several weeks versus and electing her hospice benefits on discharge. -She is very interested in transitioning back to [facility] with hospice, however, she wants the input of friends home on where she would best be served as far as their levels of care (independent living versus ALF versus SNF care) and how these would factor into her decision to elect her hospice benefit. -Philosophically, she seems very in line with a plan for transitioning back to [facility] with hospice, however, she would appreciate further input from social work and friends [facility] regarding what would be the best plan for her moving forward.  I did touch base with social work who will reach out to friends and to discuss further.  My recommendation based upon conversation today would be to consider transition back to independent living with hospice following.  I do think that she is going to continue to progress to needing assisted living at some point in  the near future.  Code Status/Advance Care Planning:  DNR   Symptom Management:   As above  Palliative Prophylaxis:   Bowel Regimen and Delirium Protocol  Psycho-social/Spiritual:   Desire for further Chaplaincy support:no  Additional Recommendations: Education on Hospice  Prognosis:   < 6 months most likely.  She has known ovarian mass with high concern for ovarian cancer with significant progression in size as well as decreased in nutrition and functional status.  I am concerned that this is the beginning of an acute decline and she may progress quickly from this point forward.  Discharge Planning: To Be Determined- ? [facility]      Primary Diagnoses: Present on Admission: . Ovarian mass . HTN (hypertension) . Nausea and vomiting . Breast mass in female . Dehydration   I have reviewed the medical record, interviewed the patient and family, and examined the patient. The following aspects are pertinent.  Past Medical History:  Diagnosis Date  . Arthritis   . HTN (hypertension)   . Hyperlipemia   . Osteopenia   . Ovarian mass    Social History   Socioeconomic History  . Marital status: Widowed    Spouse name: Not on file  . Number of children: Not on file  . Years of education: Not on file  . Highest education level: Not on file  Occupational History  . Not on file  Social Needs  . Financial resource strain: Not on file  . Food insecurity:    Worry: Not on file    Inability: Not on file  . Transportation needs:    Medical: Not on file    Non-medical: Not on file  Tobacco Use  . Smoking status: Never Smoker  . Smokeless tobacco: Never Used  Substance and Sexual Activity  . Alcohol use: Not Currently  . Drug use: Not on file  . Sexual activity: Not on file  Lifestyle  . Physical activity:    Days per week: Not on file    Minutes per session: Not on file  . Stress: Not on file  Relationships  . Social connections:    Talks on  phone: Not on file    Gets together: Not on file    Attends religious service: Not on file    Active member of club or organization: Not on file    Attends meetings of clubs or organizations: Not on file    Relationship status: Not on file  Other Topics Concern  . Not on file  Social History Narrative  .  Not on file   Family History  Problem Relation Age of Onset  . Ovarian cancer Sister    Scheduled Meds: . amLODipine  10 mg Oral Daily  . latanoprost  1 drop Both Eyes QHS  . lisinopril  20 mg Oral Daily  . metoCLOPramide  5 mg Oral TID AC   Continuous Infusions: PRN Meds:.acetaminophen **OR** acetaminophen, HYDROcodone-acetaminophen, ondansetron **OR** ondansetron (ZOFRAN) IV Medications Prior to Admission:  Prior to Admission medications   Medication Sig Start Date End Date Taking? Authorizing Provider  amLODipine (NORVASC) 10 MG tablet Take 10 mg by mouth daily.   Yes [provider]  aspirin 81 MG tablet Take 81 mg by mouth daily.   Yes [provider]  Calcium Carbonate-Vitamin D (CALCIUM-D) 600-400 MG-UNIT TABS Take 1 tablet by mouth daily. With food   Yes [provider]  fosinopril (MONOPRIL) 20 MG tablet Take 20 mg by mouth daily.   Yes [provider]  LUMIGAN 0.01 % SOLN Place 1 drop into both eyes at bedtime.  03/20/17  Yes [provider]  meloxicam (MOBIC) 7.5 MG tablet Take 7.5 mg by mouth daily.   Yes [provider]  Multiple Vitamins-Minerals (MULTIVITAMIN WITH MINERALS) tablet Take 1 tablet by mouth daily.   Yes [provider]   No Known Allergies Review of Systems  Constitutional: Positive for activity change, appetite change and fatigue.  Gastrointestinal: Positive for abdominal distention.  Neurological: Positive for weakness.  Psychiatric/Behavioral: Positive for sleep disturbance.   Physical Exam  General: Alert, awake, in no acute distress.  HEENT: No bruits, no goiter, no JVD Heart:  Regular rate and rhythm. No murmur appreciated. Lungs: Good air movement, clear Ext: No significant edema Skin: Warm and dry Neuro: Grossly intact, nonfocal.   Vital Signs: BP (!) 173/69 (BP Location: Right Arm)   Pulse 83   Temp 98.5 F (36.9 C) (Oral)   Resp 18   Ht 5' (1.524 m)   Wt 68.7 kg (151 lb 8 oz)   SpO2 99%   BMI 29.59 kg/m  Pain Scale: 0-10   Pain Score: 0-No pain   SpO2: SpO2: 99 % O2 Device:SpO2: 99 % O2 Flow Rate: .   IO: Intake/output summary:   Intake/Output Summary (Last 24 hours) at 05/17/2017 1501 Last data filed at 05/17/2017 1000 Gross per 24 hour  Intake 240 ml  Output -  Net 240 ml    LBM: Last BM Date: 05/15/17 Baseline Weight: Weight: 67.6 kg (149 lb) Most recent weight: Weight: 68.7 kg (151 lb 8 oz)     Palliative Assessment/Data:   Flowsheet Rows     Most Recent Value  Intake Tab  Referral Department  Hospitalist  Unit at Time of Referral  Med/Surg Unit  Palliative Care Primary Diagnosis  Cancer  Date Notified  05/16/17  Palliative Care Type  New Palliative care  Reason for referral  Clarify Goals of Care  Date of Admission  05/15/17  Date first seen by Palliative Care  05/17/17  # of days Palliative referral response time  1 Day(s)  # of days IP prior to Palliative referral  1  Clinical Assessment  Palliative Performance Scale Score  60%  Pain Max last 24 hours  3  Pain Min Last 24 hours  0  Psychosocial & Spiritual Assessment  Palliative Care Outcomes  Patient/Family meeting held?  Yes  Who was at the meeting?  Patient  Palliative Care Outcomes  Improved non-pain symptom therapy, Clarified goals of  care, Counseled regarding hospice      Time In: 1540 Time Out: 1700 Time Total: 80 Greater than 50%  of this time was spent counseling and coordinating care related to the above assessment and plan.  Signed by: Micheline Rough, MD   Please contact Palliative Medicine Team phone at [phone] for questions and concerns.  For  individual provider: See Shea Evans

## 2017-05-17 NOTE — Care Management CC44 (Signed)
Condition Code 44 Documentation Completed  Patient Details  Name: Ann Cameron MRN: [id number] Date of Birth: [birth year]   Condition Code 44 given:  Yes Patient signature on Condition Code 44 notice:  Yes Documentation of 2 MD's agreement:  Yes Code 44 added to claim:  Yes    Jorden Minchey, Lynnae Sandhoffngela N, RN 05/17/2017, 5:04 PM

## 2017-05-17 NOTE — Evaluation (Signed)
Physical Therapy Evaluation Patient Details Name: Ann Cameron MRN: [id number] DOB: 1922-04-17 Today's Date: 05/17/2017   History of Present Illness  84yo female who called EMS due to nausea and vomiting, unsteadiness. Ovarian mass was found during this admission, thought to be progressive malignancy, also new L breast mass. PMH OA, HTN, gout, breast CA   Clinical Impression   Patient received in chair, very pleasant and willing to participate in PT this afternoon. She is able to complete functional transfers with general S, also able to ambulate approximately 275f in hallway with no device and general S; mild unsteadiness noted during gait however patient able to self-correct without additional assist from PT. She was left up in the chair with all needs met this afternoon. She will continue to benefit from skilled PT services in the acute setting, and will strongly benefit from skilled PT care at her independent living facility moving forward.     Follow Up Recommendations Other (comment)(PT at her independent living facility )    Equipment Recommendations  None recommended by PT(appears to have all necessary DME )    Recommendations for Other Services       Precautions / Restrictions Precautions Precautions: Fall Restrictions Weight Bearing Restrictions: No      Mobility  Bed Mobility               General bed mobility comments: DNT, received up in chair   Transfers Overall transfer level: Needs assistance Equipment used: None Transfers: Sit to/from Stand Sit to Stand: Supervision         General transfer comment: S for safety, patient steady and with good safety awareness   Ambulation/Gait Ambulation/Gait assistance: Supervision Ambulation Distance (Feet): 250 Feet Assistive device: None Gait Pattern/deviations: Step-through pattern;Decreased step length - right;Decreased step length - left;Decreased stride length;Decreased dorsiflexion - right;Decreased  dorsiflexion - left     General Gait Details: B LE ER noted, patient able to maintain steady but slow gait speed with only very minor balance deficits noted, able to correct without additional assist from PT   Stairs            Wheelchair Mobility    Modified Rankin (Stroke Patients Only)       Balance Overall balance assessment: Needs assistance Sitting-balance support: Feet supported Sitting balance-Leahy Scale: Normal     Standing balance support: During functional activity Standing balance-Leahy Scale: Good                               Pertinent Vitals/Pain Pain Assessment: No/denies pain    Home Living Family/patient expects to be discharged to:: Assisted living               Home Equipment: Shower seat - built in Additional Comments: [facility]    Prior Function Level of Independence: Independent               Hand Dominance        Extremity/Trunk Assessment   Upper Extremity Assessment Upper Extremity Assessment: Defer to OT evaluation    Lower Extremity Assessment Lower Extremity Assessment: Overall WFL for tasks assessed    Cervical / Trunk Assessment Cervical / Trunk Assessment: Normal  Communication   Communication: No difficulties  Cognition Arousal/Alertness: Awake/alert Behavior During Therapy: WFL for tasks assessed/performed Overall Cognitive Status: Within Functional Limits for tasks assessed  General Comments      Exercises     Assessment/Plan    PT Assessment Patient needs continued PT services  PT Problem List Decreased strength;Decreased coordination;Decreased activity tolerance;Decreased balance       PT Treatment Interventions DME instruction;Therapeutic activities;Gait training;Therapeutic exercise;Patient/family education;Stair training;Balance training;Functional mobility training;Neuromuscular re-education    PT Goals (Current  goals can be found in the Care Plan section)  Acute Rehab PT Goals Patient Stated Goal: to move normally, walk like I used to  PT Goal Formulation: With patient Time For Goal Achievement: 05/31/17 Potential to Achieve Goals: Good    Frequency Min 3X/week   Barriers to discharge        Co-evaluation               AM-PAC PT "6 Clicks" Daily Activity  Outcome Measure Difficulty turning over in bed (including adjusting bedclothes, sheets and blankets)?: None Difficulty moving from lying on back to sitting on the side of the bed? : None Difficulty sitting down on and standing up from a chair with arms (e.g., wheelchair, bedside commode, etc,.)?: None Help needed moving to and from a bed to chair (including a wheelchair)?: None Help needed walking in hospital room?: None Help needed climbing 3-5 steps with a railing? : A Little 6 Click Score: 23    End of Session   Activity Tolerance: Patient tolerated treatment well Patient left: with call bell/phone within reach;with chair alarm set   PT Visit Diagnosis: Muscle weakness (generalized) (M62.81);Unsteadiness on feet (R26.81)    Time: 3762-8315 PT Time Calculation (min) (ACUTE ONLY): 15 min   Charges:   PT Evaluation $PT Eval Low Complexity: 1 Low     PT G Codes:        Deniece Ree PT, DPT, CBIS  Supplemental Physical Therapist [place]   Pager [phone]

## 2017-05-17 NOTE — Progress Notes (Signed)
PROGRESS NOTE    Ann Cameron  MKL:[id number] DOB: 1922-05-14 DOA: 05/15/2017 PCP: Seward Carol, MD   Brief Narrative:  Ann Cameron is a 84 year old female who presents with acute onset lightheadedness with vomiting and persistent nausea. Past medical history is significant for known pelvic mass, breast cancer status post radiation and lumpectomy, hypertension, dyslipidemia and CKD baseline 1.4. Reports feeling unsteady on her feet, calls it  "vertigo". Reports noticing a mass in her left outer breast, about 1 year ago, which feels very similar to the breast cancer mass she had in 1991. Patient aware of pelvic mass and does not want to pursue surgical resection, chemo or radiation. On admission her abdomen was visibly distended. Denies fever, chills, chest pain or shortness of breath. Blood pressure 175/69 mmHg, pulse 95, respirations 20, temperature 97.8, oxygen 97% on room air. Sodium 142, potassium 4.0, chloride 107, CO2 23, glucose 203, BUN 21, creatinine 1.41, calcium 9.7, Anion gap 12, alk phos 60, albumin 4.1, lipase 27, AST 35, ALT 15, GFR 36, troponin <0.03, WBC 12.4, Hgb 12.6, neutrophils 11.1, lymphocytes 0.6. UA negative for UTI, EKG sinus rhythm with premature atrial complex. CT abdomen and pelvis shows increase in pelvic mass size, now measuring 26 cm, concerning for ovarian neoplasm. Gallstones. Diffuse diverticular disease of the colon without acute inflammation. Partially visualized 19 mm slightly dense mass in the outer left breast. RUQ US shows Cholelithiasis without other evidence of acute cholecystitis. CT head shows normal aging brain.  Patient was admitted with a working diagnosis of acute onset vomiting with persistent nausea in the setting of known ovarian mass.  Assessment & Plan:   Active Problems:   Ovarian mass   Cholelithiasis   HTN (hypertension)   Nausea and vomiting   Breast mass in female   Dehydration   Ovarian mass -Enlargement of previously  noted pelvic mass, 14 cm in 03/18. Now 26 cm, worrisome for ovarian neoplasm. -Patient states she does not want to undergo surgical resection, chemo or radiation.  Breast mass -Patient reports left breast mass present for about 1 year. -CT abdomen and pelvis shows partially visualized 19 mm slightly dense mass in the left outer breast.  -Patient states she does not want to undergo surgical resection, chemo or radiation.  Cholelithiasis -CT abdomen and pelvis noted gallstones with possible wall thickening or gallbladder wall edema. -RUQ US showed cholelithiasis without other evidence of acute cholecystitis. -Patient states she does not want to undergo surgery and would like to have this issue medically managed if needed.  Hypertension -Uncontrolled. -Continue amlodipine and lisinopril.  Dehydration secondary to nausea and vomiting - Improved -Continue Zofran as needed.  DVT prophylaxis: Lovenox. Code Status: DNR. Family Communication: None at bedside. Disposition Plan: ALF.   Consultants:   None.  Procedures:   None.  Antimicrobials:   None  Subjective: Out of bed and sitting in chair. States she is feeling bloated with abdominal discomfort and decreased appetite. Denies any nausea, vomiting, diarrhea.  Objective: Vitals:   05/16/17 2046 05/16/17 2207 05/17/17 0515 05/17/17 1229  BP: (!) 175/77 (!) 155/68 (!) 164/60 (!) 173/69  Pulse: 75 72 70 83  Resp: 18  17 18   Temp: 97.9 F (36.6 C)  98.3 F (36.8 C) 98.5 F (36.9 C)  TempSrc: Oral  Oral Oral  SpO2: 99%  96% 99%  Weight:      Height:        Intake/Output Summary (Last 24 hours) at 05/17/2017 1308 Last data  filed at 05/17/2017 1000 Gross per 24 hour  Intake 240 ml  Output -  Net 240 ml   Filed Weights   05/15/17 1852 05/16/17 0233  Weight: 67.6 kg (149 lb) 68.7 kg (151 lb 8 oz)    Examination:  General exam: Appears calm and comfortable  Respiratory system: Clear to auscultation. Respiratory  effort normal. Cardiovascular system: S1 & S2 heard, RRR. No murmurs, rubs, gallops or clicks. No pedal edema. Gastrointestinal system: Abdomen is distended, soft and mildly tender. Normal bowel sounds heard. Central nervous system: Alert and oriented. No focal neurological deficits. Extremities: Moves all four extremities. Skin: No rashes, lesions or ulcers. Psychiatry: Judgement and insight appear normal. Mood & affect appropriate.     Data Reviewed: I have personally reviewed following labs and imaging studies  CBC: Recent Labs  Lab 05/15/17 1942 05/16/17 0733  WBC 12.4* 11.2*  NEUTROABS 11.1*  --   HGB 12.6 11.6*  HCT 39.0 37.1  MCV 89.0 89.4  PLT 178 379   Basic Metabolic Panel: Recent Labs  Lab 05/15/17 1942 05/16/17 0733  NA 142 139  K 4.0 4.0  CL 107 109  CO2 23 21*  GLUCOSE 203* 98  BUN 21* 14  CREATININE 1.41* 1.08*  CALCIUM 9.7 9.0  MG  --  1.7  PHOS  --  2.4*   GFR: Estimated Creatinine Clearance: 28.8 mL/min (A) (by C-G formula based on SCr of 1.08 mg/dL (H)). Liver Function Tests: Recent Labs  Lab 05/15/17 1942 05/16/17 0733  AST 25 23  ALT 15 13*  ALKPHOS 60 53  BILITOT 0.8 0.8  PROT 7.6 6.6  ALBUMIN 4.1 3.6   Recent Labs  Lab 05/15/17 1942  LIPASE 27   No results for input(s): AMMONIA in the last 168 hours. Coagulation Profile: No results for input(s): INR, PROTIME in the last 168 hours. Cardiac Enzymes: Recent Labs  Lab 05/16/17 0047 05/16/17 0733 05/16/17 1219  TROPONINI <0.03 0.04* 0.04*   BNP (last 3 results) No results for input(s): PROBNP in the last 8760 hours. HbA1C: Recent Labs    05/16/17 0733  HGBA1C 4.6*   CBG: No results for input(s): GLUCAP in the last 168 hours. Lipid Profile: No results for input(s): CHOL, HDL, LDLCALC, TRIG, CHOLHDL, LDLDIRECT in the last 72 hours. Thyroid Function Tests: Recent Labs    05/16/17 0733  TSH 1.025   Anemia Panel: No results for input(s): VITAMINB12, FOLATE,  FERRITIN, TIBC, IRON, RETICCTPCT in the last 72 hours. Sepsis Labs: No results for input(s): PROCALCITON, LATICACIDVEN in the last 168 hours.  Recent Results (from the past 240 hour(s))  Urine culture     Status: Abnormal   Collection Time: 05/15/17 10:22 PM  Result Value Ref Range Status   Specimen Description   Final    URINE, RANDOM Performed at [facility] [address], [place], [place] [address]    Special Requests   Final    NONE Performed at [facility], [place] [address], [place], [place] [address]    Culture MULTIPLE SPECIES PRESENT, SUGGEST RECOLLECTION (A)  Final   Report Status 05/17/2017 FINAL  Final  MRSA PCR Screening     Status: None   Collection Time: 05/16/17  2:34 AM  Result Value Ref Range Status   MRSA by PCR NEGATIVE NEGATIVE Final    Comment:        The GeneXpert MRSA Assay (FDA approved for NASAL specimens only), is one component of a comprehensive MRSA  colonization surveillance program. It is not intended to diagnose MRSA infection nor to guide or monitor treatment for MRSA infections. Performed at [facility], [place] [address], [place],  [address]          Radiology Studies: Dg Chest 2 View  Result Date: 05/17/2017 CLINICAL DATA:  Preop ovarian mass, nonsmoker.  No chest complaints. EXAM: CHEST - 2 VIEW COMPARISON:  None. FINDINGS: Cardiac size upper limits normal. LEFT axillary surgical clips, no consolidation or edema. Small RIGHT pleural effusion. No osseous findings. IMPRESSION: Borderline cardiac enlargement. No active infiltrates or failure. Small RIGHT pleural effusion. LEFT axillary surgical clips, query related to breast lesion. Electronically Signed   By: Staci Righter M.D.   On: 05/17/2017 11:28   Ct Head Wo Contrast  Result Date: 05/16/2017 CLINICAL DATA:  Ataxia with nausea and vomiting. EXAM: CT HEAD WITHOUT CONTRAST TECHNIQUE: Contiguous axial images were obtained  from the base of the skull through the vertex without intravenous contrast. COMPARISON:  None. FINDINGS: Brain: No mass lesion, intraparenchymal hemorrhage or extra-axial collection. No evidence of acute cortical infarct. Normal appearance of the brain parenchyma and extra axial spaces for age. Vascular: No hyperdense vessel or unexpected vascular calcification. Skull: Normal visualized skull base, calvarium and extracranial soft tissues. Sinuses/Orbits: Chronic right maxillary sinusitis.  Normal orbits. IMPRESSION: Normal aging brain. Electronically Signed   By: Ulyses Jarred M.D.   On: 05/16/2017 01:47   Mr Jeri Cos FV Contrast  Result Date: 05/16/2017 CLINICAL DATA:  84 y/o F; ongoing dizziness, evaluate for possible CVA. History of breast cancer. EXAM: MRI HEAD WITHOUT AND WITH CONTRAST TECHNIQUE: Multiplanar, multiecho pulse sequences of the brain and surrounding structures were obtained without and with intravenous contrast. CONTRAST:  70m MULTIHANCE GADOBENATE DIMEGLUMINE 529 MG/ML IV SOLN COMPARISON:  05/16/2017 CT of the head FINDINGS: Brain: No acute infarction, hemorrhage, hydrocephalus, extra-axial collection or mass lesion. Fewnonspecific foci of T2 FLAIR hyperintense signal abnormality in subcortical and periventricular white matter are compatible withmildchronic microvascular ischemic changes for age. Mildbrain parenchymal volume loss. Small foci of susceptibility hypointensity are present in the right occipital lobe and left cerebral peduncle, probably hemosiderin deposition of chronic microhemorrhage. No abnormal enhancement. Vascular: Normal flow voids. Skull and upper cervical spine: Normal marrow signal. Sinuses/Orbits: Moderate right maxillary sinus mucosal thickening with mucous retention cyst. Otherwise negative. Other: 12 mm round low signal focus within left floor of mouth and upstream dilatation of left submandibular gland duct, probably a sialolith (series 10, image 21). IMPRESSION: 1.  No acute intracranial process or abnormal enhancement. 2. Mild for age chronic microvascular ischemic changes and parenchymal volume loss of the brain. 3. Probable 12 mm sialolith within the left floor of mouth with mild upstream dilatation of the left submandibular gland duct. Electronically Signed   By: LKristine GarbeM.D.   On: 05/16/2017 20:44   Ct Abdomen Pelvis W Contrast  Result Date: 05/15/2017 CLINICAL DATA:  Nausea and vomiting EXAM: CT ABDOMEN AND PELVIS WITH CONTRAST TECHNIQUE: Multidetector CT imaging of the abdomen and pelvis was performed using the standard protocol following bolus administration of intravenous contrast. CONTRAST:  1045mISOVUE-300 IOPAMIDOL (ISOVUE-300) INJECTION 61% COMPARISON:  04/11/2013 FINDINGS: Lower chest: Lung bases demonstrate no acute consolidation or pleural effusion. Borderline cardiomegaly. Partially visualized 19 mm mass in the outer left breast. Mild esophageal hiatal hernia. Hepatobiliary: No focal hepatic abnormality. Calcified stones in the gallbladder. Possible mild gallbladder wall thickening or edema. No biliary dilatation Pancreas: Unremarkable. No pancreatic ductal dilatation or  surrounding inflammatory changes. Spleen: Normal in size without focal abnormality. Adrenals/Urinary Tract: Adrenal glands are within normal limits. Indeterminate hypodense lesion upper pole left kidney measuring 1 cm. Subcentimeter hypodense renal lesions too small to further characterize. No hydronephrosis. Distended bladder. Stomach/Bowel: Stomach nonenlarged. No dilated small bowel. Diffuse diverticular disease of the colon without acute inflammation. Vascular/Lymphatic: Moderate aortic atherosclerosis. No aneurysmal dilatation. No significantly enlarged lymph nodes. Reproductive: Interval increase in size of a large complex cystic mass filling the abdomen and pelvis, now measuring approximately 26 cm transverse by 15.5 cm AP by 17.7 cm craniocaudad Other: Negative for  free air or significant free fluid. Musculoskeletal: Stable sclerotic lesion in L3. Grade 1 anterolisthesis of L4 on L5. Degenerative changes at L5-S1. IMPRESSION: 1. Interval increase in size of a massive complex cystic and solid mass with calcifications, now measuring up to 26 cm, concerning for ovarian neoplasm. 2. Gallstones. Possible wall thickening or gallbladder wall edema. Correlation with right upper quadrant abdominal ultrasound suggested. 3. Diffuse diverticular disease of the colon without acute inflammation. 4. Partially visualized 19 mm slightly dense mass in the outer left breast. Correlation with outpatient mammography and ultrasound if felt clinically indicated. Electronically Signed   By: Donavan Foil M.D.   On: 05/15/2017 23:09   US Abdomen Limited Ruq  Result Date: 05/16/2017 CLINICAL DATA:  Cholelithiasis and vomiting EXAM: ULTRASOUND ABDOMEN LIMITED RIGHT UPPER QUADRANT COMPARISON:  CT abdomen pelvis 05/15/2017 FINDINGS: Gallbladder: There is a large stone in the gallbladder measuring up to 3.1 cm. Negative sonographic Murphy sign. No wall thickening or pericholecystic fluid. Common bile duct: Diameter: 1.5 mm, normal. Liver: No focal lesion identified. Within normal limits in parenchymal echogenicity. Portal vein is patent on color Doppler imaging with normal direction of blood flow towards the liver. IMPRESSION: Cholelithiasis without other evidence of acute cholecystitis. Electronically Signed   By: Ulyses Jarred M.D.   On: 05/16/2017 01:55    Scheduled Meds: . amLODipine  10 mg Oral Daily  . latanoprost  1 drop Both Eyes QHS  . lisinopril  20 mg Oral Daily   Continuous Infusions:   LOS: 1 day    Time spent: 25 minutes.   Eloy End, PA-S [facility] Pager [phone]  If 7PM-7AM, please contact night-coverage [email or web address] Password [email or web address] 05/17/2017, 1:08 PM

## 2017-05-18 DIAGNOSIS — N63 Unspecified lump in unspecified breast: Secondary | ICD-10-CM | POA: Diagnosis not present

## 2017-05-18 DIAGNOSIS — N839 Noninflammatory disorder of ovary, fallopian tube and broad ligament, unspecified: Secondary | ICD-10-CM | POA: Diagnosis not present

## 2017-05-18 DIAGNOSIS — K8020 Calculus of gallbladder without cholecystitis without obstruction: Secondary | ICD-10-CM | POA: Diagnosis not present

## 2017-05-18 DIAGNOSIS — Z7189 Other specified counseling: Secondary | ICD-10-CM | POA: Diagnosis not present

## 2017-05-18 DIAGNOSIS — E860 Dehydration: Secondary | ICD-10-CM | POA: Diagnosis not present

## 2017-05-18 DIAGNOSIS — I10 Essential (primary) hypertension: Secondary | ICD-10-CM | POA: Diagnosis not present

## 2017-05-18 DIAGNOSIS — R112 Nausea with vomiting, unspecified: Secondary | ICD-10-CM | POA: Diagnosis not present

## 2017-05-18 LAB — BASIC METABOLIC PANEL
Anion gap: 8 (ref 5–15)
BUN: 11 mg/dL (ref 6–20)
CHLORIDE: 108 mmol/L (ref 101–111)
CO2: 22 mmol/L (ref 22–32)
Calcium: 9.3 mg/dL (ref 8.9–10.3)
Creatinine, Ser: 1.27 mg/dL — ABNORMAL HIGH (ref 0.44–1.00)
GFR calc non Af Amer: 36 mL/min — ABNORMAL LOW (ref >60)
GFR, EST AFRICAN AMERICAN: 41 mL/min — AB (ref >60)
Glucose, Bld: 94 mg/dL (ref 65–99)
POTASSIUM: 4.1 mmol/L (ref 3.5–5.1)
SODIUM: 138 mmol/L (ref 135–145)

## 2017-05-18 MED ORDER — SODIUM CHLORIDE 0.9 % IV SOLN
INTRAVENOUS | Status: DC
  Administered 2017-05-18: 15:00:00 via INTRAVENOUS

## 2017-05-18 NOTE — Progress Notes (Signed)
PROGRESS NOTE    Ann Cameron  WUJ:[id number] DOB: 1922-08-23 DOA: 05/15/2017 PCP: Renford Dills, MD    Brief Narrative:  84 year old female who presented with nausea and vomiting.  She does have a significant past medical history for known pelvic mass, hypertension, dyslipidemia, osteoporosis, history of breast cancer status post radiation and lumpectomy, gout and chronic kidney disease.  Patient complain of persistent nausea and postprandial vomiting, associated with  imbalance and generalized weakness.  On initial physical examination blood pressure 175/69, heart rate 95, respiratory 20, temperature is 97.8, oxygen saturation 97%.  Dry mucous membranes, lungs clear to auscultation, heart S1 suppressive rhythmic, the abdomen was soft, distended, non-tender, no lower extremity edema.  CT of the pelvis with interval increase in size of a massive complex cyst solid mass with calcification, now measured 26 cm, concerning for ovarian neoplasm.  Positive gallstones,  Patient was admitted to the hospital with a working diagnosis of nausea and vomiting related to worsening pelvic mass.    Assessment & Plan:   Active Problems:   Ovarian mass   Cholelithiasis   HTN (hypertension)   Nausea and vomiting   Breast mass in female   Dehydration  1. Intractable nausea and vomiting due to large pelvic mass. Patient tolerated well full liquid diet, will advance diet to regular heart healthy and follow on response. High risk of recurrent nausea and vomiting, will continue observation for the next 24 hours, as needed IV antiemetics.   2. Cholelithiasis. Patient tolerating well po diet, will continue conservative care.   3. Large worsening pelvic mass. Patient has agree to be follow up by hospice as outpatient. Will have physical therapy and occupational therapy re-evaluation today in preparation for discharge in am.  4. HTN. Continue amlodipine and lisinopril, for blood pressure control.  5.  AKI. Check BMP today, continue to advance diet.     DVT prophylaxis: enoxaparin   Code Status:  dnr Family Communication: no family at the bedside 3 Disposition Plan:  home   Consultants:   Palliative care   Procedures:     Antimicrobials:      Subjective: Patient is feeling better, not back to her baseline, has been tolerating well full liquids, feeling weak and deconditioned. Has agreed to be discharge with hospice services.   Objective: Vitals:   05/17/17 1229 05/17/17 2051 05/18/17 0402 05/18/17 0910  BP: (!) 173/69 (!) 179/69 (!) 177/77 (!) 152/68  Pulse: 83 68 67 86  Resp: 18 18 20 14   Temp: 98.5 F (36.9 C) 98.9 F (37.2 C) 98.3 F (36.8 C) 98.6 F (37 C)  TempSrc: Oral Oral Oral Oral  SpO2: 99% 98% 97% 96%  Weight:      Height:        Intake/Output Summary (Last 24 hours) at 05/18/2017 1257 Last data filed at 05/18/2017 1000 Gross per 24 hour  Intake 360 ml  Output -  Net 360 ml   Filed Weights   05/15/17 1852 05/16/17 0233  Weight: 67.6 kg (149 lb) 68.7 kg (151 lb 8 oz)    Examination:   General: deconditioned Neurology: Awake and alert, non focal  E ENT: mild pallor, no icterus, oral mucosa moist Cardiovascular: No JVD. S1-S2 present, rhythmic, no gallops, rubs, or murmurs. No lower extremity edema. Pulmonary: decreased breath sounds bilaterally at bases, adequate air movement, no wheezing, rhonchi or rales. Gastrointestinal. Abdomen distended with no organomegaly, non tender, no rebound or guarding Skin. No rashes Musculoskeletal: no joint deformities  Data Reviewed: I have personally reviewed following labs and imaging studies  CBC: Recent Labs  Lab 05/15/17 1942 05/16/17 0733  WBC 12.4* 11.2*  NEUTROABS 11.1*  --   HGB 12.6 11.6*  HCT 39.0 37.1  MCV 89.0 89.4  PLT 178 274   Basic Metabolic Panel: Recent Labs  Lab 05/15/17 1942 05/16/17 0733 05/18/17 0433  NA 142 139 138  K 4.0 4.0 4.1  CL 107 109 108  CO2  23 21* 22  GLUCOSE 203* 98 94  BUN 21* 14 11  CREATININE 1.41* 1.08* 1.27*  CALCIUM 9.7 9.0 9.3  MG  --  1.7  --   PHOS  --  2.4*  --    GFR: Estimated Creatinine Clearance: 24.5 mL/min (A) (by C-G formula based on SCr of 1.27 mg/dL (H)). Liver Function Tests: Recent Labs  Lab 05/15/17 1942 05/16/17 0733  AST 25 23  ALT 15 13*  ALKPHOS 60 53  BILITOT 0.8 0.8  PROT 7.6 6.6  ALBUMIN 4.1 3.6   Recent Labs  Lab 05/15/17 1942  LIPASE 27   No results for input(s): AMMONIA in the last 168 hours. Coagulation Profile: No results for input(s): INR, PROTIME in the last 168 hours. Cardiac Enzymes: Recent Labs  Lab 05/16/17 0047 05/16/17 0733 05/16/17 1219  TROPONINI <0.03 0.04* 0.04*   BNP (last 3 results) No results for input(s): PROBNP in the last 8760 hours. HbA1C: Recent Labs    05/16/17 0733  HGBA1C 4.6*   CBG: No results for input(s): GLUCAP in the last 168 hours. Lipid Profile: No results for input(s): CHOL, HDL, LDLCALC, TRIG, CHOLHDL, LDLDIRECT in the last 72 hours. Thyroid Function Tests: Recent Labs    05/16/17 0733  TSH 1.025   Anemia Panel: No results for input(s): VITAMINB12, FOLATE, FERRITIN, TIBC, IRON, RETICCTPCT in the last 72 hours.    Radiology Studies: I have reviewed all of the imaging during this hospital visit personally     Scheduled Meds: . amLODipine  10 mg Oral Daily  . latanoprost  1 drop Both Eyes QHS  . lisinopril  20 mg Oral Daily  . metoCLOPramide  5 mg Oral TID AC   Continuous Infusions:   LOS: 1 day        Reyana Leisey Annett Gulaaniel Keilah Lemire, MD [facility] Pager [phone]

## 2017-05-18 NOTE — Progress Notes (Signed)
Daily Progress Note   Patient Name: Ann Cameron       Date: 05/18/2017 DOB: 1922-02-05  Age: 84 y.o. MRN#: [id number] Attending Physician: Tawni Millers Primary Care Physician: Seward Carol, MD Admit Date: 05/15/2017  Reason for Consultation/Follow-up: Establishing goals of care and Non pain symptom management  Subjective: I met today with Ann Cameron.  She reports that she has been working in conjunction with CSW to facilitate plan to transition back to [place] with hospice support on discharge (hopefully tomorrow).  She continues to report feeling "bloated" but states that appetite and intake were "much, much better" with reglan prior to meals.  She requested this be continued on discharge.  Length of Stay: 1  Current Medications: Scheduled Meds:  . amLODipine  10 mg Oral Daily  . latanoprost  1 drop Both Eyes QHS  . lisinopril  20 mg Oral Daily  . metoCLOPramide  5 mg Oral TID AC    Continuous Infusions: . sodium chloride      PRN Meds: acetaminophen **OR** acetaminophen, HYDROcodone-acetaminophen, ondansetron **OR** ondansetron (ZOFRAN) IV  Physical Exam         General: Alert, awake, in no acute distress.  HEENT: No bruits, no goiter, no JVD Heart: Regular rate and rhythm. No murmur appreciated. Lungs: Good air movement, clear Ext: No significant edema Skin: Warm and dry Neuro: Grossly intact, nonfocal.    Vital Signs: BP 139/67 (BP Location: Right Arm)   Pulse 80   Temp 98.9 F (37.2 C) (Oral)   Resp 16   Ht 5' (1.524 m)   Wt 68.7 kg (151 lb 8 oz)   SpO2 97%   BMI 29.59 kg/m  SpO2: SpO2: 97 % O2 Device: O2 Device: Room Air O2 Flow Rate:    Intake/output summary:   Intake/Output Summary (Last 24 hours) at 05/18/2017 1638 Last  data filed at 05/18/2017 1000 Gross per 24 hour  Intake 360 ml  Output -  Net 360 ml   LBM: Last BM Date: 05/17/17 Baseline Weight: Weight: 67.6 kg (149 lb) Most recent weight: Weight: 68.7 kg (151 lb 8 oz)       Palliative Assessment/Data:    Flowsheet Rows     Most Recent Value  Intake Tab  Referral Department  Hospitalist  Unit at Time of Referral  Med/Surg Unit  Palliative Care Primary Diagnosis  Cancer  Date Notified  05/16/17  Palliative Care Type  New Palliative care  Reason for referral  Clarify Goals of Care  Date of Admission  05/15/17  Date first seen by Palliative Care  05/17/17  # of days Palliative referral response time  1 Day(s)  # of days IP prior to Palliative referral  1  Clinical Assessment  Palliative Performance Scale Score  60%  Pain Max last 24 hours  3  Pain Min Last 24 hours  0  Psychosocial & Spiritual Assessment  Palliative Care Outcomes  Patient/Family meeting held?  Yes  Who was at the meeting?  Patient  Palliative Care Outcomes  Improved non-pain symptom therapy, Clarified goals of care, Counseled regarding hospice      Patient Active Problem List   Diagnosis Date Noted  . Ovarian mass 05/16/2017  . Cholelithiasis 05/16/2017  . HTN (hypertension) 05/16/2017  . Nausea and vomiting 05/16/2017  . Breast mass in female 05/16/2017  . Dehydration 05/16/2017    Palliative Care Assessment & Plan   Patient Profile: 84 y.o. female  with past medical history of colic mass, hypertension, osteoporosis, breast cancer status post radiation and lumpectomy, chronic kidney disease admitted on 05/15/2017 with lightheadedness related to new onset nausea and vomiting.  She has subsequently been found to have increase in size of known ovarian mass as well as a breast mass.  Palliative consulted for goals of care  Assessment: Patient Active Problem List   Diagnosis Date Noted  . Ovarian mass 05/16/2017  . Cholelithiasis 05/16/2017  . HTN  (hypertension) 05/16/2017  . Nausea and vomiting 05/16/2017  . Breast mass in female 05/16/2017  . Dehydration 05/16/2017    Recommendations/Plan: -  Fullness/nausea/anorexia: Likely related to ovarian mass.  Would continue Reglan as she feels this has been beneficial.  - Recommend back to [facility] with hospice support on discharge.  Appreciate CSW assistance in facilitating this.   Goals of Care and Additional Recommendations:  Limitations on Scope of Treatment: Avoid Hospitalization  Code Status:    Code Status Orders  (From admission, onward)        Start     Ordered   05/16/17 0219  Do not attempt resuscitation (DNR)  Continuous    Question Answer Comment  In the event of cardiac or respiratory ARREST Do not call a "code blue"   In the event of cardiac or respiratory ARREST Do not perform Intubation, CPR, defibrillation or ACLS   In the event of cardiac or respiratory ARREST Use medication by any route, position, wound care, and other measures to relive pain and suffering. May use oxygen, suction and manual treatment of airway obstruction as needed for comfort.      05/16/17 0218    Code Status History    This patient has a current code status but no historical code status.    Advance Directive Documentation     Most Recent Value  Type of Advance Directive  Healthcare Power of Attorney  Pre-existing out of facility DNR order (yellow form or pink MOST form)  -  "MOST" Form in Place?  -       Prognosis:   < 6 months  Discharge Planning:  [facility] with hospice  Care plan was discussed with Patient, Dr. Cathlean Sauer  Thank you for allowing the Palliative Medicine Team to assist in the care of this patient.   Total Time 20 Prolonged Time Billed No  Greater than 50%  of this time was spent counseling and coordinating care related to the above assessment and plan.  Micheline Rough, MD  Please contact Palliative Medicine Team phone at [phone] for  questions and concerns.

## 2017-05-18 NOTE — Progress Notes (Signed)
Met with pt again today to discuss disposition plans. Also discussed with attending and palliative team.  Pt has decided she wants hospice services at home at DC- lives at [facility] independent living apartment. Discussed options an pt agreed to referral to [facility]. CM following for home hospice referral. Updated Vaughan Basta at [facility] for communication/coordination of care purposes. Vaughan Basta states if additional resources needed once pt arrives home [facility] can assist.   If pt's discharged within business hours, [facility] can provide transportation home- [employer] at [phone].  Sharren Bridge, MSW, LCSW Clinical Social Work 05/18/2017 [phone]

## 2017-05-18 NOTE — Progress Notes (Signed)
Per conversation with CSW. Pt wants to DC with home hospice services with [facility]. Referral called to [facility]. Liaison to meet with pt tomorrow at hospital. CM will continue to follow and assist as needed. Sandford Crazeora Almedia Cordell RN,BSN,NCM [phone]

## 2017-05-18 NOTE — Progress Notes (Signed)
Occupational Therapy Treatment Patient Details Name: HONOUR SCHWIEGER MRN: [id number] DOB: 04-09-1922 Today's Date: 05/18/2017    History of present illness 84yo female who called EMS due to nausea and vomiting, unsteadiness. Ovarian mass was found during this admission, thought to be progressive malignancy, also new L breast mass. PMH OA, HTN, gout, breast CA    OT comments  Pt is at a supervision level now.  Endurance improved  Follow Up Recommendations  Home health OT(if pt returns to [facility] living) for IADLs   Equipment Recommendations  None recommended by OT    Recommendations for Other Services      Precautions / Restrictions Precautions Precautions: Fall Restrictions Weight Bearing Restrictions: No       Mobility Bed Mobility                  Transfers   Equipment used: None   Sit to Stand: Supervision         General transfer comment: no LOB when walking    Balance             Standing balance-Leahy Scale: Good                             ADL either performed or assessed with clinical judgement   ADL                                         General ADL Comments: pt needs overall supervision for set up/ADLs.       Vision       Perception     Praxis      Cognition Arousal/Alertness: Awake/alert Behavior During Therapy: WFL for tasks assessed/performed Overall Cognitive Status: Within Functional Limits for tasks assessed                                          Exercises     Shoulder Instructions       General Comments pt wanted to ambulate in hall; she did so with supervision    Pertinent Vitals/ Pain       Pain Assessment: No/denies pain  Home Living                                          Prior Functioning/Environment              Frequency           Progress Toward Goals  OT Goals(current goals can now be found in the care plan section)  Progress towards OT goals: Goals met/education completed, patient discharged from [facility] PT "6 Clicks" Daily Activity     Outcome Measure                    End of Session        Activity Tolerance Patient tolerated treatment well   Patient Left in chair;with call bell/phone within reach;with chair alarm set   Nurse Communication  Time: 4199-1444 OT Time Calculation (min): 10 min  Charges: OT General Charges $OT Visit: 1 Visit OT Treatments $Therapeutic Activity: 8-22 mins  Lesle Chris, OTR/L [phone] 05/18/2017   Aryaman Haliburton 05/18/2017, 3:26 PM

## 2017-05-19 DIAGNOSIS — N839 Noninflammatory disorder of ovary, fallopian tube and broad ligament, unspecified: Secondary | ICD-10-CM | POA: Diagnosis not present

## 2017-05-19 DIAGNOSIS — E860 Dehydration: Secondary | ICD-10-CM | POA: Diagnosis not present

## 2017-05-19 DIAGNOSIS — K8020 Calculus of gallbladder without cholecystitis without obstruction: Secondary | ICD-10-CM | POA: Diagnosis not present

## 2017-05-19 DIAGNOSIS — N179 Acute kidney failure, unspecified: Secondary | ICD-10-CM

## 2017-05-19 DIAGNOSIS — I10 Essential (primary) hypertension: Secondary | ICD-10-CM | POA: Diagnosis not present

## 2017-05-19 LAB — BASIC METABOLIC PANEL
ANION GAP: 7 (ref 5–15)
BUN: 13 mg/dL (ref 6–20)
CHLORIDE: 110 mmol/L (ref 101–111)
CO2: 22 mmol/L (ref 22–32)
Calcium: 9.1 mg/dL (ref 8.9–10.3)
Creatinine, Ser: 1.28 mg/dL — ABNORMAL HIGH (ref 0.44–1.00)
GFR calc non Af Amer: 35 mL/min — ABNORMAL LOW (ref >60)
GFR, EST AFRICAN AMERICAN: 41 mL/min — AB (ref >60)
Glucose, Bld: 108 mg/dL — ABNORMAL HIGH (ref 65–99)
POTASSIUM: 4.2 mmol/L (ref 3.5–5.1)
Sodium: 139 mmol/L (ref 135–145)

## 2017-05-19 MED ORDER — METOCLOPRAMIDE HCL 5 MG PO TABS: 5.0000 mg | ORAL_TABLET | Freq: Three times a day (TID) | ORAL | 0 refills | Status: AC

## 2017-05-19 MED ORDER — MORPHINE SULFATE (PF) 4 MG/ML IV SOLN
2.0000 mg | Freq: Once | INTRAVENOUS | Status: AC
  Administered 2017-05-19: 2 mg via INTRAVENOUS
  Filled 2017-05-19: qty 1

## 2017-05-19 NOTE — Progress Notes (Signed)
Physical Therapy Treatment Patient Details Name: Ann Cameron MRN: [id number] DOB: 07-Jul-1922 Today's Date: 05/19/2017    History of Present Illness 84yo female who called EMS due to nausea and vomiting, unsteadiness. Ovarian mass was found during this admission, thought to be progressive malignancy, also new L breast mass. PMH OA, HTN, gout, breast CA     PT Comments    Pt OOB in recliner.  Assisted with amb in hallway. General Gait Details: good alternating gait with functional gait speed and no need for any AD.    Follow Up Recommendations  [facility]     Equipment Recommendations  None recommended by PT    Recommendations for Other Services       Precautions / Restrictions Precautions Precautions: Fall Restrictions Weight Bearing Restrictions: No    Mobility  Bed Mobility               General bed mobility comments: OOB in recliner   Transfers Overall transfer level: Needs assistance Equipment used: None Transfers: Sit to/from Stand Sit to Stand: Supervision         General transfer comment: good safety cognition  Ambulation/Gait Ambulation/Gait assistance: Min guard;Supervision Ambulation Distance (Feet): 285 Feet Assistive device: None Gait Pattern/deviations: Step-through pattern;Decreased step length - right;Decreased step length - left;Decreased stride length;Decreased dorsiflexion - right;Decreased dorsiflexion - left Gait velocity: WFL   General Gait Details: good alternating gait with functional gait speed and no need for any AD.     Stairs             Wheelchair Mobility    Modified Rankin (Stroke Patients Only)       Balance                                            Cognition Arousal/Alertness: Awake/alert Behavior During Therapy: WFL for tasks assessed/performed Overall Cognitive Status: Within Functional Limits for tasks assessed                                         Exercises      General Comments        Pertinent Vitals/Pain Pain Assessment: No/denies pain    Home Living                      Prior Function            PT Goals (current goals can now be found in the care plan section) Progress towards PT goals: Progressing toward goals    Frequency    Min 3X/week      PT Plan      Co-evaluation              AM-PAC PT "6 Clicks" Daily Activity  Outcome Measure  Difficulty turning over in bed (including adjusting bedclothes, sheets and blankets)?: None Difficulty moving from lying on back to sitting on the side of the bed? : None Difficulty sitting down on and standing up from a chair with arms (e.g., wheelchair, bedside commode, etc,.)?: None Help needed moving to and from a bed to chair (including a wheelchair)?: None Help needed walking in hospital room?: None Help needed climbing 3-5 steps with a railing? : A Little 6 Click Score:  23    End of Session Equipment Utilized During Treatment: Gait belt Activity Tolerance: Patient tolerated treatment well Patient left: with call bell/phone within reach;with chair alarm set Nurse Communication: Mobility status PT Visit Diagnosis: Muscle weakness (generalized) (M62.81);Unsteadiness on feet (R26.81)     Time: 1610-9604 PT Time Calculation (min) (ACUTE ONLY): 19 min  Charges:  $Gait Training: 8-22 mins                    G Codes:       Felecia Shelling  PTA [facility]  Acute  Rehab Pager      [phone]

## 2017-05-19 NOTE — Discharge Summary (Signed)
Physician Discharge Summary  Ann Cameron ZOX:[id number] DOB: Feb 15, 1922 DOA: 05/15/2017  PCP: Renford Dills, MD  Admit date: 05/15/2017 Discharge date: 05/19/2017  Admitted From: Home Disposition: Home with home hospice  Recommendations for Outpatient Follow-up and new medication changes:  1. Follow up with PCP in 1- week 2. Patient will need a voiding trial as outpatient next week 3. Started on metoclopramide tid with meals.   Home Health: yes  Equipment/Devices: no    Discharge Condition: stable  CODE STATUS: DNR  Diet recommendation: heart healthy  Brief/Interim Summary: 84 year old female who presented with nausea and vomiting. She does have a significant past medical history for known pelvic mass, hypertension, dyslipidemia, osteoporosis, history of breast cancer status post radiation and lumpectomy, gout and chronic kidney disease. Patient complain of persistent nausea and postprandial vomiting,associated with imbalance and generalized weakness.On initial physical examination blood pressure 175/69, heart rate 95, respiratory rate 20, temperature  97.8, oxygen saturation 97%.Dry mucous membranes, lungs clear to auscultation, heart S1 S2 present and rhythmic, the abdomen was soft, distended, non-tender, no lower extremity edema.  Sodium 142,  potassium 4.0, chloride 107, bicarb 23, glucose 203, BUN 21, creatinine 1.41, white count 12.4, hemoglobin 12.6, hematocrit 39.0, platelets 178.  Urine analysis specific gravity 1.014, 30 protein, pH 8.0, 6-30 white cells, 6-5 RBCs. Chest x-ray with small right pleural effusion.  CT of the pelvis with interval increase in size of a massive complex cyst solid mass with calcification, now measured 26 cm, concerning for ovarian neoplasm. Positive gallstones.  EKG normal sinus rhythm with a first-degree AV block.   Patient was admitted to the hospital with a working diagnosis of nausea and vomiting related to worsening pelvic mass.  1.   Large pelvic mass, 26 cm, complicated by intractable nausea and vomiting.  Was admitted to the medical ward, she was placed on intravenous fluids, received antiemetics and analgesics.  Patient was placed on metoclopramide 5 mg 3 times daily with meals with improvement of her symptoms.  Her diet was advanced progressively with good toleration. Patient is not willing to pursue  further testing for her pelvic mass, palliative care team was consulted, patient will be discharged with home hospice services.  2.  Cholelithiasis.  Further testing with ultrasonography showed no cholecystitis, conservative management, her symptoms are not likely related to gallstones.   3.  Hypertension.  Continue amlodipine and lisinopril for blood pressure control.  4.  Acute kidney injury. Patient received IV fluids, discharge creatinine 1.2 with potassium of 4.2, and serum bicarbonate of 22.  5.  Acute urinary retention.  Patient was found to have urinary retention, likely related to mass-effect related to her pelvic lesion, indwelling Foley catheter was placed with improvement in her symptoms.  She will be discharged with Foley catheter, instructions to have a voiding trial as an outpatient next week.    Discharge Diagnoses:  Active Problems:   Ovarian mass   Cholelithiasis   HTN (hypertension)   Nausea and vomiting   Breast mass in female   Dehydration    Discharge Instructions   Allergies as of 05/19/2017   No Known Allergies     Medication List    TAKE these medications   amLODipine 10 MG tablet Commonly known as:  NORVASC Take 10 mg by mouth daily.   aspirin 81 MG tablet Take 81 mg by mouth daily.   Calcium-D 600-400 MG-UNIT Tabs Take 1 tablet by mouth daily. With food   fosinopril 20 MG tablet Commonly known as:  MONOPRIL Take 20 mg by mouth daily.   LUMIGAN 0.01 % Soln Generic drug:  bimatoprost Place 1 drop into both eyes at bedtime.   meloxicam 7.5 MG tablet Commonly known as:   MOBIC Take 7.5 mg by mouth daily.   metoCLOPramide 5 MG tablet Commonly known as:  REGLAN Take 1 tablet (5 mg total) by mouth 3 (three) times daily before meals for 15 days.   multivitamin with minerals tablet Take 1 tablet by mouth daily.      Follow-up Information    [facility] Follow up.   Contact information: [address] [place] [address] [phone]          No Known Allergies  Consultations:  Palliative care    Procedures/Studies: Dg Chest 2 View  Result Date: 05/17/2017 CLINICAL DATA:  Preop ovarian mass, nonsmoker.  No chest complaints. EXAM: CHEST - 2 VIEW COMPARISON:  None. FINDINGS: Cardiac size upper limits normal. LEFT axillary surgical clips, no consolidation or edema. Small RIGHT pleural effusion. No osseous findings. IMPRESSION: Borderline cardiac enlargement. No active infiltrates or failure. Small RIGHT pleural effusion. LEFT axillary surgical clips, query related to breast lesion. Electronically Signed   By: Elsie Stain M.D.   On: 05/17/2017 11:28   Ct Head Wo Contrast  Result Date: 05/16/2017 CLINICAL DATA:  Ataxia with nausea and vomiting. EXAM: CT HEAD WITHOUT CONTRAST TECHNIQUE: Contiguous axial images were obtained from the base of the skull through the vertex without intravenous contrast. COMPARISON:  None. FINDINGS: Brain: No mass lesion, intraparenchymal hemorrhage or extra-axial collection. No evidence of acute cortical infarct. Normal appearance of the brain parenchyma and extra axial spaces for age. Vascular: No hyperdense vessel or unexpected vascular calcification. Skull: Normal visualized skull base, calvarium and extracranial soft tissues. Sinuses/Orbits: Chronic right maxillary sinusitis.  Normal orbits. IMPRESSION: Normal aging brain. Electronically Signed   By: Deatra Robinson M.D.   On: 05/16/2017 01:47   Mr Laqueta Jean WG Contrast  Result Date: 05/16/2017 CLINICAL DATA:  84 y/o F; ongoing dizziness, evaluate for  possible CVA. History of breast cancer. EXAM: MRI HEAD WITHOUT AND WITH CONTRAST TECHNIQUE: Multiplanar, multiecho pulse sequences of the brain and surrounding structures were obtained without and with intravenous contrast. CONTRAST:  14mL MULTIHANCE GADOBENATE DIMEGLUMINE 529 MG/ML IV SOLN COMPARISON:  05/16/2017 CT of the head FINDINGS: Brain: No acute infarction, hemorrhage, hydrocephalus, extra-axial collection or mass lesion. Fewnonspecific foci of T2 FLAIR hyperintense signal abnormality in subcortical and periventricular white matter are compatible withmildchronic microvascular ischemic changes for age. Mildbrain parenchymal volume loss. Small foci of susceptibility hypointensity are present in the right occipital lobe and left cerebral peduncle, probably hemosiderin deposition of chronic microhemorrhage. No abnormal enhancement. Vascular: Normal flow voids. Skull and upper cervical spine: Normal marrow signal. Sinuses/Orbits: Moderate right maxillary sinus mucosal thickening with mucous retention cyst. Otherwise negative. Other: 12 mm round low signal focus within left floor of mouth and upstream dilatation of left submandibular gland duct, probably a sialolith (series 10, image 21). IMPRESSION: 1. No acute intracranial process or abnormal enhancement. 2. Mild for age chronic microvascular ischemic changes and parenchymal volume loss of the brain. 3. Probable 12 mm sialolith within the left floor of mouth with mild upstream dilatation of the left submandibular gland duct. Electronically Signed   By: Mitzi Hansen M.D.   On: 05/16/2017 20:44   Ct Abdomen Pelvis W Contrast  Result Date: 05/15/2017 CLINICAL DATA:  Nausea and vomiting EXAM: CT ABDOMEN AND PELVIS WITH CONTRAST  TECHNIQUE: Multidetector CT imaging of the abdomen and pelvis was performed using the standard protocol following bolus administration of intravenous contrast. CONTRAST:  ISOVUE-300 IOPAMIDOL (ISOVUE-300) INJECTION 61%  COMPARISON:  04/11/2013 FINDINGS: Lower chest: Lung bases demonstrate no acute consolidation or pleural effusion. Borderline cardiomegaly. Partially visualized 19 mm mass in the outer left breast. Mild esophageal hiatal hernia. Hepatobiliary: No focal hepatic abnormality. Calcified stones in the gallbladder. Possible mild gallbladder wall thickening or edema. No biliary dilatation Pancreas: Unremarkable. No pancreatic ductal dilatation or surrounding inflammatory changes. Spleen: Normal in size without focal abnormality. Adrenals/Urinary Tract: Adrenal glands are within normal limits. Indeterminate hypodense lesion upper pole left kidney measuring 1 cm. Subcentimeter hypodense renal lesions too small to further characterize. No hydronephrosis. Distended bladder. Stomach/Bowel: Stomach nonenlarged. No dilated small bowel. Diffuse diverticular disease of the colon without acute inflammation. Vascular/Lymphatic: Moderate aortic atherosclerosis. No aneurysmal dilatation. No significantly enlarged lymph nodes. Reproductive: Interval increase in size of a large complex cystic mass filling the abdomen and pelvis, now measuring approximately 26 cm transverse by 15.5 cm AP by 17.7 cm craniocaudad Other: Negative for free air or significant free fluid. Musculoskeletal: Stable sclerotic lesion in L3. Grade 1 anterolisthesis of L4 on L5. Degenerative changes at L5-S1. IMPRESSION: 1. Interval increase in size of a massive complex cystic and solid mass with calcifications, now measuring up to 26 cm, concerning for ovarian neoplasm. 2. Gallstones. Possible wall thickening or gallbladder wall edema. Correlation with right upper quadrant abdominal ultrasound suggested. 3. Diffuse diverticular disease of the colon without acute inflammation. 4. Partially visualized 19 mm slightly dense mass in the outer left breast. Correlation with outpatient mammography and ultrasound if felt clinically indicated. Electronically Signed   By: Jasmine Pang M.D.   On: 05/15/2017 23:09   US Abdomen Limited Ruq  Result Date: 05/16/2017 CLINICAL DATA:  Cholelithiasis and vomiting EXAM: ULTRASOUND ABDOMEN LIMITED RIGHT UPPER QUADRANT COMPARISON:  CT abdomen pelvis 05/15/2017 FINDINGS: Gallbladder: There is a large stone in the gallbladder measuring up to 3.1 cm. Negative sonographic Murphy sign. No wall thickening or pericholecystic fluid. Common bile duct: Diameter: 1.5 mm, normal. Liver: No focal lesion identified. Within normal limits in parenchymal echogenicity. Portal vein is patent on color Doppler imaging with normal direction of blood flow towards the liver. IMPRESSION: Cholelithiasis without other evidence of acute cholecystitis. Electronically Signed   By: Deatra Robinson M.D.   On: 05/16/2017 01:55       Subjective: Patient feeling better, abdominal pain improved after foley catheter placement, no nausea or vomiting.   Discharge Exam: Vitals:   05/18/17 2020 05/19/17 0634  BP: (!) 157/79 (!) 152/68  Pulse: 75 72  Resp: 18 18  Temp: 98.7 F (37.1 C) 98.6 F (37 C)  SpO2: 98% 98%   Vitals:   05/18/17 0910 05/18/17 1438 05/18/17 2020 05/19/17 0634  BP: (!) 152/68 139/67 (!) 157/79 (!) 152/68  Pulse: 86 80 75 72  Resp: 14 16 18 18   Temp: 98.6 F (37 C) 98.9 F (37.2 C) 98.7 F (37.1 C) 98.6 F (37 C)  TempSrc: Oral Oral Oral Oral  SpO2: 96% 97% 98% 98%  Weight:      Height:        General: Not in pain or dyspnea, Neurology: Awake and alert, non focal  E ENT: no pallor, no icterus, oral mucosa moist Cardiovascular: No JVD. S1-S2 present, rhythmic, no gallops, rubs, or murmurs. No lower extremity edema. Pulmonary: decreased breath sounds bilaterally, adequate air movement, no  wheezing, rhonchi or rales. Gastrointestinal. Abdomen distended, no organomegaly, non tender, no rebound or guarding Skin. No rashes Musculoskeletal: no joint deformities   The results of significant diagnostics from this hospitalization  (including imaging, microbiology, ancillary and laboratory) are listed below for reference.     Microbiology: Recent Results (from the past 240 hour(s))  Urine culture     Status: Abnormal   Collection Time: 05/15/17 10:22 PM  Result Value Ref Range Status   Specimen Description   Final    URINE, RANDOM Performed at [facility], [address] [place], [place] [address]    Special Requests   Final    NONE Performed at [facility], [address] [place], [place] [address]    Culture MULTIPLE SPECIES PRESENT, SUGGEST RECOLLECTION (A)  Final   Report Status 05/17/2017 FINAL  Final  MRSA PCR Screening     Status: None   Collection Time: 05/16/17  2:34 AM  Result Value Ref Range Status   MRSA by PCR NEGATIVE NEGATIVE Final    Comment:        The GeneXpert MRSA Assay (FDA approved for NASAL specimens only), is one component of a comprehensive MRSA colonization surveillance program. It is not intended to diagnose MRSA infection nor to guide or monitor treatment for MRSA infections. Performed at [facility], [address] [place], [place] [address]      Labs: BNP (last 3 results) No results for input(s): BNP in the last 8760 hours. Basic Metabolic Panel: Recent Labs  Lab 05/15/17 1942 05/16/17 0733 05/18/17 0433 05/19/17 0431  NA 142 139 138 139  K 4.0 4.0 4.1 4.2  CL 107 109 108 110  CO2 23 21* 22 22  GLUCOSE 203* 98 94 108*  BUN 21* 14 11 13   CREATININE 1.41* 1.08* 1.27* 1.28*  CALCIUM 9.7 9.0 9.3 9.1  MG  --  1.7  --   --   PHOS  --  2.4*  --   --    Liver Function Tests: Recent Labs  Lab 05/15/17 1942 05/16/17 0733  AST 25 23  ALT 15 13*  ALKPHOS 60 53  BILITOT 0.8 0.8  PROT 7.6 6.6  ALBUMIN 4.1 3.6   Recent Labs  Lab 05/15/17 1942  LIPASE 27   No results for input(s): AMMONIA in the last 168 hours. CBC: Recent Labs  Lab 05/15/17 1942 05/16/17 0733  WBC 12.4* 11.2*  NEUTROABS  11.1*  --   HGB 12.6 11.6*  HCT 39.0 37.1  MCV 89.0 89.4  PLT 178 274   Cardiac Enzymes: Recent Labs  Lab 05/16/17 0047 05/16/17 0733 05/16/17 1219  TROPONINI <0.03 0.04* 0.04*   BNP: Invalid input(s): POCBNP CBG: No results for input(s): GLUCAP in the last 168 hours. D-Dimer No results for input(s): DDIMER in the last 72 hours. Hgb A1c No results for input(s): HGBA1C in the last 72 hours. Lipid Profile No results for input(s): CHOL, HDL, LDLCALC, TRIG, CHOLHDL, LDLDIRECT in the last 72 hours. Thyroid function studies No results for input(s): TSH, T4TOTAL, T3FREE, THYROIDAB in the last 72 hours.  Invalid input(s): FREET3 Anemia work up No results for input(s): VITAMINB12, FOLATE, FERRITIN, TIBC, IRON, RETICCTPCT in the last 72 hours. Urinalysis    Component Value Date/Time   COLORURINE YELLOW 05/15/2017 2222   APPEARANCEUR CLEAR 05/15/2017 2222   LABSPEC 1.014 05/15/2017 2222   PHURINE 8.0 05/15/2017 2222   GLUCOSEU NEGATIVE 05/15/2017 2222   HGBUR NEGATIVE 05/15/2017  2222   BILIRUBINUR NEGATIVE 05/15/2017 2222   KETONESUR 5 (A) 05/15/2017 2222   PROTEINUR 30 (A) 05/15/2017 2222   NITRITE NEGATIVE 05/15/2017 2222   LEUKOCYTESUR TRACE (A) 05/15/2017 2222   Sepsis Labs Invalid input(s): PROCALCITONIN,  WBC,  LACTICIDVEN Microbiology Recent Results (from the past 240 hour(s))  Urine culture     Status: Abnormal   Collection Time: 05/15/17 10:22 PM  Result Value Ref Range Status   Specimen Description   Final    URINE, RANDOM Performed at [facility], [address] [place], [place] [address]    Special Requests   Final    NONE Performed at [facility], [address] [place], [place] [address]    Culture MULTIPLE SPECIES PRESENT, SUGGEST RECOLLECTION (A)  Final   Report Status 05/17/2017 FINAL  Final  MRSA PCR Screening     Status: None   Collection Time: 05/16/17  2:34 AM  Result Value Ref Range Status   MRSA by  PCR NEGATIVE NEGATIVE Final    Comment:        The GeneXpert MRSA Assay (FDA approved for NASAL specimens only), is one component of a comprehensive MRSA colonization surveillance program. It is not intended to diagnose MRSA infection nor to guide or monitor treatment for MRSA infections. Performed at [facility], [address] [place], [place] [address]      Time coordinating discharge: 45 minutes  SIGNED:   Coralie KeensMauricio Daniel Indigo Chaddock, MD  [facility] 05/19/2017, 11:01 AM Pager [phone]  If 7PM-7AM, please contact night-coverage [email or web address] Password [email or web address]

## 2018-10-29 ENCOUNTER — Non-Acute Institutional Stay (SKILLED_NURSING_FACILITY): Payer: Medicare Other | Admitting: Internal Medicine

## 2018-10-29 ENCOUNTER — Encounter: Payer: Self-pay | Admitting: Internal Medicine

## 2018-10-29 DIAGNOSIS — I10 Essential (primary) hypertension: Secondary | ICD-10-CM

## 2018-10-29 DIAGNOSIS — R600 Localized edema: Secondary | ICD-10-CM | POA: Diagnosis not present

## 2018-10-29 DIAGNOSIS — R112 Nausea with vomiting, unspecified: Secondary | ICD-10-CM

## 2018-10-29 DIAGNOSIS — R1900 Intra-abdominal and pelvic swelling, mass and lump, unspecified site: Secondary | ICD-10-CM | POA: Diagnosis not present

## 2018-10-29 NOTE — Progress Notes (Signed)
Provider:  Einar Crow  MD Location:  [facility] Room Number: 9 Place of Service:  SNF ([phone])  PCP: Mahlon Gammon, MD Patient Care Team: Mahlon Gammon, MD as PCP - General (Internal Medicine)  Extended Emergency Contact Information Primary Emergency Contact: Morrison,Pless [place] Home Phone: [phone] Mobile Phone: [phone] Relation: Nephew Secondary Emergency Contact: Fayne Norrie, [place] Home Phone: [phone] Relation: Friend  Code Status:DNR Goals of Care: Advanced Directive information Advanced Directives 10/29/2018  Does Patient Have a Medical Advance Directive? Yes  Type of Estate agent of [place];Living will;Out of facility DNR (pink MOST or yellow form)  Does patient want to make changes to medical advance directive? No - Patient declined  Copy of Healthcare Power of Attorney in Chart? Yes - validated most recent copy scanned in chart (See row information)  Pre-existing out of facility DNR order (yellow form or pink MOST form) Yellow form placed in chart (order not valid for inpatient use)      Chief Complaint  Patient presents with  . New Admit To SNF    HPI: Patient is a 85 y.o. female seen today for admission to SNF for Long term care and Hospice Care Patient has a history of known pelvic mass.  A detailed work-up was not done as patient refused.  She also has a history of hypertension hyperlipidemia, history of breast cancer status post radiation and lumpectomy, gout and CKD Patient has known that she has pelvic mass the past few years.  She refused any surgical intervention any further work-up.  She is now in hospice care.  The mass has grown in size and she has ascites lower extremity edema weakness fatigue and pain.  She [place] herself.  For now she is in SNF for comfort care.  Patient is alert and oriented and is able to give detailed history.  She walks with a walker and cane.  She did not have any falls.  Was unable to do her ADLs. Denies any nausea vomiting.  Eating good.  Denies any shortness of breath.  Pain is controlled on oxycodone as needed  Past Medical History:  Diagnosis Date  . Arthritis   . HTN (hypertension)   . Hyperlipemia   . Osteopenia   . Ovarian mass    No past surgical history on file.  reports that she has never smoked. She has never used smokeless tobacco. She reports previous alcohol use. No history on file for drug. Social History   Socioeconomic History  . Marital status: Widowed    Spouse name: Not on file  . Number of children: Not on file  . Years of education: Not on file  . Highest education level: Not on file  Occupational History  . Not on file  Social Needs  . Financial resource strain: Not on file  . Food insecurity    Worry: Not on file    Inability: Not on file  . Transportation needs    Medical: Not on file    Non-medical: Not on file  Tobacco Use  . Smoking status: Never Smoker  . Smokeless tobacco: Never Used  Substance and Sexual Activity  . Alcohol use: Not Currently  . Drug use: Not on file  . Sexual activity: Not on file  Lifestyle  . Physical  activity    Days per week: Not on file    Minutes per session: Not on file  . Stress: Not on file  Relationships  . Social Herbalist on phone: Not on file    Gets together: Not on file    Attends religious service: Not on file    Active member of club or organization: Not on file    Attends meetings of clubs or organizations: Not on file    Relationship status: Not on file  . Intimate partner violence    Fear of current or ex partner: Not on file    Emotionally abused: Not on file    Physically abused: Not on file    Forced sexual activity: Not on file  Other Topics Concern  . Not on file  Social History Narrative  . Not on file     Functional Status Survey:    Family History  Problem Relation Age of Onset  . Ovarian cancer Sister     Health Maintenance  Topic Date Due  . [name]  07/15/1941  . DEXA SCAN  07/14/1989  . PNA vac Low Risk Adult (2 of 2 - PCV13) 02/24/2006  . INFLUENZA VACCINE  08/25/2018    No Known Allergies  Outpatient Encounter Medications as of 10/29/2018  Medication Sig  . amLODipine (NORVASC) 10 MG tablet Take 10 mg by mouth daily.  Marland Kitchen aspirin 81 MG tablet Take 81 mg by mouth daily.  Derrill Memo ON 10/30/2018] dexamethasone (DECADRON) 4 MG tablet Take 4 mg by mouth daily. In the morning  . furosemide (LASIX) 20 MG tablet Take 20 mg by mouth daily.  Marland Kitchen lisinopril (ZESTRIL) 20 MG tablet Take 20 mg by mouth daily.  Marland Kitchen LUMIGAN 0.01 % SOLN Place 1 drop into both eyes at bedtime.   . meloxicam (MOBIC) 7.5 MG tablet Take 7.5 mg by mouth daily.  Marland Kitchen omeprazole (PRILOSEC) 20 MG capsule Take 20 mg by mouth daily.  Marland Kitchen oxycodone (OXY-IR) 5 MG capsule Take 5 mg by mouth every 2 (two) hours as needed for pain. And SOB  . sennosides-docusate sodium (SENOKOT-S) 8.6-50 MG tablet Take 1 tablet by mouth daily.  Marland Kitchen zinc oxide 20 % ointment Apply 1 application topically as needed for irritation. To buttocks after every incontinent episode and as needed for redness. May keep at bedside.  . metoCLOPramide (REGLAN) 5 MG tablet Take 1 tablet (5 mg total) by mouth 3 (three) times daily before meals for 15 days.  . [DISCONTINUED] Calcium Carbonate-Vitamin D (CALCIUM-D) 600-400 MG-UNIT TABS Take 1 tablet by mouth daily. With food  . [DISCONTINUED] fosinopril (MONOPRIL) 20 MG tablet Take 20 mg by mouth daily.  . [DISCONTINUED] Multiple Vitamins-Minerals (MULTIVITAMIN WITH MINERALS) tablet Take 1 tablet by mouth daily.   No facility-administered encounter medications on file as of 10/29/2018.     Review of Systems  Constitutional: Positive for activity change and appetite change.  HENT: Negative.   Cardiovascular:  Positive for leg swelling.  Gastrointestinal: Positive for abdominal distention, abdominal pain and constipation.  Genitourinary: Negative.   Musculoskeletal: Positive for back pain.  Neurological: Positive for weakness.  Psychiatric/Behavioral: Negative.   All other systems reviewed and are negative.   Vitals:   10/29/18 1636  BP: 128/78  Pulse: 89  Resp: 18  Temp: 97.6 F (36.4 C)  SpO2: 93%  Weight: 135 lb (61.2 kg)   Body mass index is 26.37 kg/m. Physical Exam Vitals signs reviewed.  Constitutional:  Appearance: Normal appearance.  HENT:     Head: Normocephalic.     Nose: Nose normal.     Mouth/Throat:     Mouth: Mucous membranes are moist.     Pharynx: Oropharynx is clear.  Eyes:     Pupils: Pupils are equal, round, and reactive to light.  Neck:     Musculoskeletal: Neck supple.  Cardiovascular:     Rate and Rhythm: Normal rate and regular rhythm.     Pulses: Normal pulses.  Pulmonary:     Effort: Pulmonary effort is normal. No respiratory distress.     Breath sounds: Normal breath sounds. No wheezing.  Abdominal:     General: There is distension.     Palpations: There is mass.     Tenderness: There is abdominal tenderness.  Musculoskeletal:     Comments: Moderate Edema Bilateral  Skin:    General: Skin is warm and dry.  Neurological:     General: No focal deficit present.     Mental Status: She is alert and oriented to person, place, and time.  Psychiatric:        Mood and Affect: Mood normal.        Thought Content: Thought content normal.        Judgment: Judgment normal.     Labs reviewed: Basic Metabolic Panel: No results for input(s): NA, K, CL, CO2, GLUCOSE, BUN, CREATININE, CALCIUM, MG, PHOS in the last 8760 hours. Liver Function Tests: No results for input(s): AST, ALT, ALKPHOS, BILITOT, PROT, ALBUMIN in the last 8760 hours. No results for input(s): LIPASE, AMYLASE in the last 8760 hours. No results for input(s): AMMONIA in the last  8760 hours. CBC: No results for input(s): WBC, NEUTROABS, HGB, HCT, MCV, PLT in the last 8760 hours. Cardiac Enzymes: No results for input(s): CKTOTAL, CKMB, CKMBINDEX, TROPONINI in the last 8760 hours. BNP: Invalid input(s): POCBNP Lab Results  Component Value Date   HGBA1C 4.6 (L) 05/16/2017   Lab Results  Component Value Date   TSH 1.025 05/16/2017   No results found for: VITAMINB12 No results found for: FOLATE No results found for: IRON, TIBC, FERRITIN  Imaging and Procedures obtained prior to SNF admission: Ct Head Wo Contrast  Result Date: 05/16/2017 CLINICAL DATA:  Ataxia with nausea and vomiting. EXAM: CT HEAD WITHOUT CONTRAST TECHNIQUE: Contiguous axial images were obtained from the base of the skull through the vertex without intravenous contrast. COMPARISON:  None. FINDINGS: Brain: No mass lesion, intraparenchymal hemorrhage or extra-axial collection. No evidence of acute cortical infarct. Normal appearance of the brain parenchyma and extra axial spaces for age. Vascular: No hyperdense vessel or unexpected vascular calcification. Skull: Normal visualized skull base, calvarium and extracranial soft tissues. Sinuses/Orbits: Chronic right maxillary sinusitis.  Normal orbits. IMPRESSION: Normal aging brain. Electronically Signed   By: Deatra Robinson M.D.   On: 05/16/2017 01:47   Mr Laqueta Jean ZO Contrast  Result Date: 05/16/2017 CLINICAL DATA:  85 y/o F; ongoing dizziness, evaluate for possible CVA. History of breast cancer. EXAM: MRI HEAD WITHOUT AND WITH CONTRAST TECHNIQUE: Multiplanar, multiecho pulse sequences of the brain and surrounding structures were obtained without and with intravenous contrast. CONTRAST:  14mL MULTIHANCE GADOBENATE DIMEGLUMINE 529 MG/ML IV SOLN COMPARISON:  05/16/2017 CT of the head FINDINGS: Brain: No acute infarction, hemorrhage, hydrocephalus, extra-axial collection or mass lesion. Fewnonspecific foci of T2 FLAIR hyperintense signal abnormality in  subcortical and periventricular white matter are compatible withmildchronic microvascular ischemic changes for age. Mildbrain parenchymal volume loss. Small  foci of susceptibility hypointensity are present in the right occipital lobe and left cerebral peduncle, probably hemosiderin deposition of chronic microhemorrhage. No abnormal enhancement. Vascular: Normal flow voids. Skull and upper cervical spine: Normal marrow signal. Sinuses/Orbits: Moderate right maxillary sinus mucosal thickening with mucous retention cyst. Otherwise negative. Other: 12 mm round low signal focus within left floor of mouth and upstream dilatation of left submandibular gland duct, probably a sialolith (series 10, image 21). IMPRESSION: 1. No acute intracranial process or abnormal enhancement. 2. Mild for age chronic microvascular ischemic changes and parenchymal volume loss of the brain. 3. Probable 12 mm sialolith within the left floor of mouth with mild upstream dilatation of the left submandibular gland duct. Electronically Signed   By: Mitzi HansenLance  Furusawa-Stratton M.D.   On: 05/16/2017 20:44   Ct Abdomen Pelvis W Contrast  Result Date: 05/15/2017 CLINICAL DATA:  Nausea and vomiting EXAM: CT ABDOMEN AND PELVIS WITH CONTRAST TECHNIQUE: Multidetector CT imaging of the abdomen and pelvis was performed using the standard protocol following bolus administration of intravenous contrast. CONTRAST:  100mL ISOVUE-300 IOPAMIDOL (ISOVUE-300) INJECTION 61% COMPARISON:  04/11/2013 FINDINGS: Lower chest: Lung bases demonstrate no acute consolidation or pleural effusion. Borderline cardiomegaly. Partially visualized 19 mm mass in the outer left breast. Mild esophageal hiatal hernia. Hepatobiliary: No focal hepatic abnormality. Calcified stones in the gallbladder. Possible mild gallbladder wall thickening or edema. No biliary dilatation Pancreas: Unremarkable. No pancreatic ductal dilatation or surrounding inflammatory changes. Spleen: Normal in size  without focal abnormality. Adrenals/Urinary Tract: Adrenal glands are within normal limits. Indeterminate hypodense lesion upper pole left kidney measuring 1 cm. Subcentimeter hypodense renal lesions too small to further characterize. No hydronephrosis. Distended bladder. Stomach/Bowel: Stomach nonenlarged. No dilated small bowel. Diffuse diverticular disease of the colon without acute inflammation. Vascular/Lymphatic: Moderate aortic atherosclerosis. No aneurysmal dilatation. No significantly enlarged lymph nodes. Reproductive: Interval increase in size of a large complex cystic mass filling the abdomen and pelvis, now measuring approximately 26 cm transverse by 15.5 cm AP by 17.7 cm craniocaudad Other: Negative for free air or significant free fluid. Musculoskeletal: Stable sclerotic lesion in L3. Grade 1 anterolisthesis of L4 on L5. Degenerative changes at L5-S1. IMPRESSION: 1. Interval increase in size of a massive complex cystic and solid mass with calcifications, now measuring up to 26 cm, concerning for ovarian neoplasm. 2. Gallstones. Possible wall thickening or gallbladder wall edema. Correlation with right upper quadrant abdominal ultrasound suggested. 3. Diffuse diverticular disease of the colon without acute inflammation. 4. Partially visualized 19 mm slightly dense mass in the outer left breast. Correlation with outpatient mammography and ultrasound if felt clinically indicated. Electronically Signed   By: Jasmine PangKim  Fujinaga M.D.   On: 05/15/2017 23:09   [place] Abdomen Limited Ruq  Result Date: 05/16/2017 CLINICAL DATA:  Cholelithiasis and vomiting EXAM: ULTRASOUND ABDOMEN LIMITED RIGHT UPPER QUADRANT COMPARISON:  CT abdomen pelvis 05/15/2017 FINDINGS: Gallbladder: There is a large stone in the gallbladder measuring up to 3.1 cm. Negative sonographic Murphy sign. No wall thickening or pericholecystic fluid. Common bile duct: Diameter: 1.5 mm, normal. Liver: No focal lesion identified. Within normal limits in  parenchymal echogenicity. Portal vein is patent on color Doppler imaging with normal direction of blood flow towards the liver. IMPRESSION: Cholelithiasis without other evidence of acute cholecystitis. Electronically Signed   By: Deatra RobinsonKevin  Herman M.D.   On: 05/16/2017 01:55    Assessment/Plan Pelvic Mass with Pain and Ascites No work-up as per patient's wishes We will continue her on oxycodone 5 mg every 2  hours PRN for pain control Will DC the PRN aspirin We will also DC Ativan for now and consider again if patient needs it She is under hospice care Supportive care History of nausea vomiting due to the mass Will continue on Reglan and dexamethasone and Prilosec Hypertension Continue on Zestril LE edema Continue on Lasix Constipation On senna and Miralax So Far its Working   Scientist, forensic Communication:   Labs/tests ordered: Total time spent in this patient care encounter was  ; greater than 50% of the visit spent counseling patient and staff, reviewing records , Labs and coordinating care for problems addressed at this encounter.

## 2018-11-12 ENCOUNTER — Encounter: Payer: Self-pay | Admitting: Internal Medicine

## 2018-11-12 ENCOUNTER — Non-Acute Institutional Stay (SKILLED_NURSING_FACILITY): Payer: Medicare Other | Admitting: Internal Medicine

## 2018-11-12 DIAGNOSIS — R52 Pain, unspecified: Secondary | ICD-10-CM

## 2018-11-12 DIAGNOSIS — R1900 Intra-abdominal and pelvic swelling, mass and lump, unspecified site: Secondary | ICD-10-CM | POA: Diagnosis not present

## 2018-11-12 DIAGNOSIS — Z515 Encounter for palliative care: Secondary | ICD-10-CM

## 2018-11-12 MED ORDER — MORPHINE SULFATE (CONCENTRATE) 20 MG/ML PO SOLN: 5.0000 mg | ORAL | 0 refills | Status: AC | PRN

## 2018-11-12 NOTE — Progress Notes (Signed)
Location:    Nursing Home Room Number: 9 Place of Service:  SNF (31) Provider:  Mahlon Gammon, MD  Mahlon Gammon, MD  Patient Care Team: Mahlon Gammon, MD as PCP - General (Internal Medicine)  Extended Emergency Contact Information Primary Emergency Contact: Morrison,Pless [place] of [place] Home Phone: [phone] Mobile Phone: [phone] Relation: Nephew Secondary Emergency Contact: Fayne Norrie, [place] Home Phone: [phone] Relation: Friend  Code Status:  DNR Goals of care: Advanced Directive information Advanced Directives 10/29/2018  Does Patient Have a Medical Advance Directive? Yes  Type of Estate agent of [place];Living will;Out of facility DNR (pink MOST or yellow form)  Does patient want to make changes to medical advance directive? No - Patient declined  Copy of Healthcare Power of Attorney in Chart? Yes - validated most recent copy scanned in chart (See row information)  Pre-existing out of facility DNR order (yellow form or pink MOST form) Yellow form placed in chart (order not valid for inpatient use)     Chief Complaint  Patient presents with  . Acute Visit    Pain manangement    HPI:  Pt is a 85 y.o. female seen today for an acute visit for End of Life care and Pain management Patient has a history of known pelvic mass.  A detailed work-up was not done as patient refused.  She also has a history of hypertension hyperlipidemia, history of breast cancer status post radiation and lumpectomy, gout and CKD Patient had refused Work up for her mass. She was admitted from her apartment for end-of-life care Nurses tell today patient has had change of status.  She is not responding.  Not eating.  Her stomach is distended and she looked uncomfortable.  Is not able to give her medications Patient was alone oriented and was walking on admission Today she is unresponsive.  Her eyes  were open.  She was moaning and attached her stomach    Past Medical History:  Diagnosis Date  . Arthritis   . HTN (hypertension)   . Hyperlipemia   . Osteopenia   . Ovarian mass    History reviewed. No pertinent surgical history.  No Known Allergies  Allergies as of 11/12/2018   No Known Allergies     Medication List       Accurate as of November 12, 2018  1:15 PM. If you have any questions, ask your nurse or doctor.        STOP taking these medications   sennosides-docusate sodium 8.6-50 MG tablet Commonly known as: SENOKOT-S Stopped by: Mahlon Gammon, MD     TAKE these medications   amLODipine 10 MG tablet Commonly known as: NORVASC Take 10 mg by mouth daily.   aspirin 81 MG tablet Take 81 mg by mouth daily.   dexamethasone 6 MG tablet Commonly known as: DECADRON Take 6 mg by mouth daily. In the morning   furosemide 20 MG tablet Commonly known as: LASIX Take 20 mg by mouth daily.   lisinopril 20 MG tablet Commonly known as: ZESTRIL Take 20 mg by mouth daily.   LORazepam 0.5 MG tablet Commonly known as: ATIVAN Take 0.5 mg by mouth every 4 (four) hours as needed for anxiety.   Lumigan 0.01 % Soln Generic drug: bimatoprost Place 1 drop into both eyes at bedtime.   meloxicam 7.5 MG tablet Commonly known as: MOBIC Take 7.5  mg by mouth daily.   metoCLOPramide 5 MG tablet Commonly known as: REGLAN Take 1 tablet (5 mg total) by mouth 3 (three) times daily before meals for 15 days.   omeprazole 20 MG capsule Commonly known as: PRILOSEC Take 20 mg by mouth daily.   oxycodone 5 MG capsule Commonly known as: OXY-IR Take 5 mg by mouth every 4 (four) hours as needed for pain. Give 2 tablets by mouth every 2 hours as needed for pain/SOB May repeat dose in 45 minutes if ineffective.   polyethylene glycol 17 g packet Commonly known as: MIRALAX / GLYCOLAX Take 17 g by mouth daily. Mix 17 grams in 4-8 ounces of liquid once daily by mouth   potassium  chloride 10 MEQ tablet Commonly known as: KLOR-CON Take 10 mEq by mouth daily.   zinc oxide 20 % ointment Apply 1 application topically as needed for irritation. To buttocks after every incontinent episode and as needed for redness. May keep at bedside.       Review of Systems  Unable to perform ROS: Patient unresponsive    Immunization History  Administered Date(s) Administered  . DTaP 02/24/2005  . Influenza,inj,Quad PF,6+ Mos 10/26/2017  . Influenza-Unspecified 01/25/2012  . Pneumococcal-Unspecified 02/24/2005   Pertinent  Health Maintenance Due  Topic Date Due  . DEXA SCAN  07/14/1989  . PNA vac Low Risk Adult (2 of 2 - PCV13) 02/24/2006  . INFLUENZA VACCINE  08/25/2018   No flowsheet data found. Functional Status Survey:    Vitals:   11/12/18 1307  BP: 100/62  Pulse: 62  Resp: 20  Temp: (!) 96.6 F (35.9 C)  SpO2: 95%  Weight: 135 lb (61.2 kg)  Height: 5' (1.524 m)   Body mass index is 26.37 kg/m. Physical Exam Vitals signs reviewed.  Constitutional:      Comments: Unresponsive  HENT:     Head: Normocephalic.     Nose: Nose normal.     Mouth/Throat:     Mouth: Mucous membranes are moist.     Pharynx: Oropharynx is clear.  Eyes:     Pupils: Pupils are equal, round, and reactive to light.  Neck:     Musculoskeletal: Neck supple.  Cardiovascular:     Rate and Rhythm: Normal rate.     Pulses: Normal pulses.  Pulmonary:     Effort: Pulmonary effort is normal.     Breath sounds: Normal breath sounds.  Abdominal:     General: There is distension.     Palpations: There is mass.     Tenderness: There is abdominal tenderness. There is guarding.  Musculoskeletal:        General: Swelling present.  Neurological:     Comments: No Response     Labs reviewed: No results for input(s): NA, K, CL, CO2, GLUCOSE, BUN, CREATININE, CALCIUM, MG, PHOS in the last 8760 hours. No results for input(s): AST, ALT, ALKPHOS, BILITOT, PROT, ALBUMIN in the last 8760  hours. No results for input(s): WBC, NEUTROABS, HGB, HCT, MCV, PLT in the last 8760 hours. Lab Results  Component Value Date   TSH 1.025 05/16/2017   Lab Results  Component Value Date   HGBA1C 4.6 (L) 05/16/2017   No results found for: CHOL, HDL, LDLCALC, LDLDIRECT, TRIG, CHOLHDL  Significant Diagnostic Results in last 30 days:  No results found.  Assessment/Plan  Patient with Pelvic Mass Now Unresponsive Not taking anything PO Will start her on Roxanol and Ativan Discontinue all Po Meds except Dexamethasone Will inform  Hospice D/W the Staff for her family to come for Compassionate Visit  Family/ staff Communication:   Labs/tests ordered:   Total time spent in this patient care encounter was  _  minutes; greater than 50% of the visit spent counseling  staff, reviewing records , Labs and coordinating care for problems addressed at this encounter.

## 2018-11-25 DEATH — deceased

## 2019-12-05 IMAGING — MR MR HEAD WO/W CM
10 of 13 series · 35 of 48 positions shown · IV contrast (multihance)
Comparison: 05/16/2017 CT of the head

CLINICAL DATA: [AGE]/o F; ongoing dizziness, evaluate for possible
CVA. History of breast cancer.

EXAM:
MRI HEAD WITHOUT AND WITH CONTRAST
TECHNIQUE: Multiplanar, multiecho pulse sequences of the brain and surrounding
structures were obtained without and with intravenous contrast.
CONTRAST:  14mL MULTIHANCE GADOBENATE DIMEGLUMINE 529 MG/ML IV SOLN

[Series 3: DWI · axial · 3.0mm · 1.09mm/px · z∈[-30,+116]mm · 9 of 100 slices shown (1 of 4)]
[im 1/100]
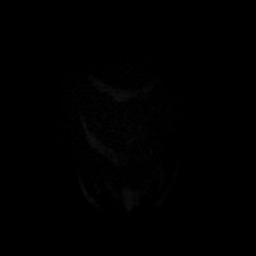
[im 13/100]
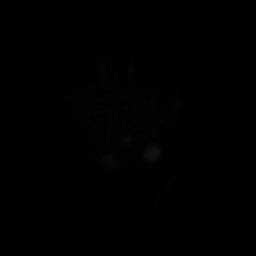
[im 25/100]
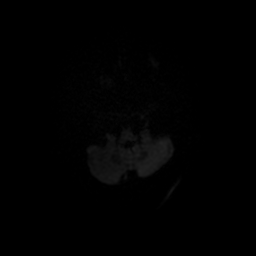
[im 38/100]
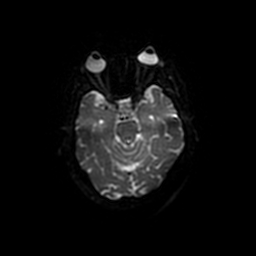
[im 50/100]
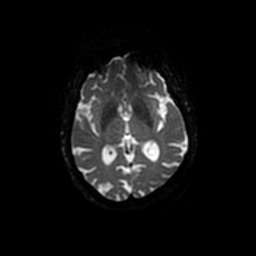
[im 62/100]
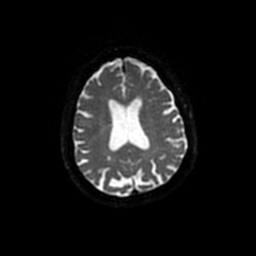
[im 75/100]
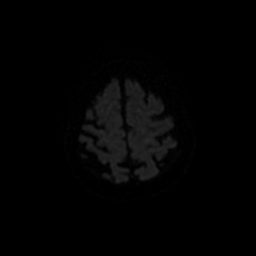
[im 87/100]
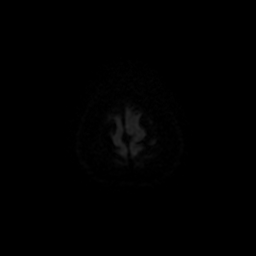
[im 100/100]
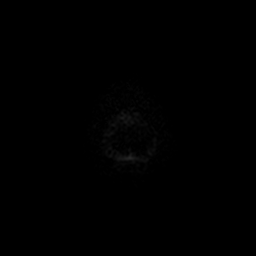

[Series 4: T1 post-contrast · coronal · 5.0mm · 0.45mm/px · 2 of 27 slices shown (1 of 2)]
[im 1/27]
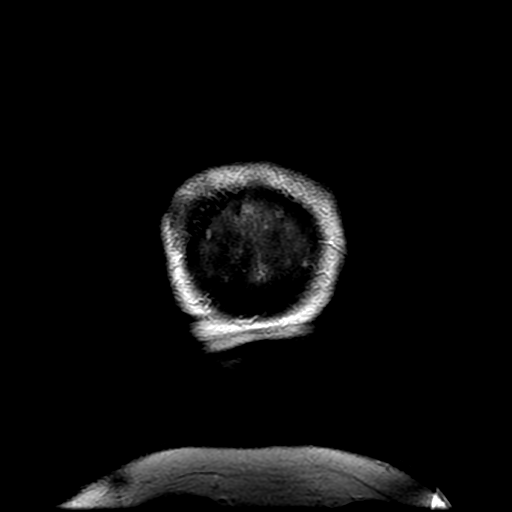
[im 27/27]
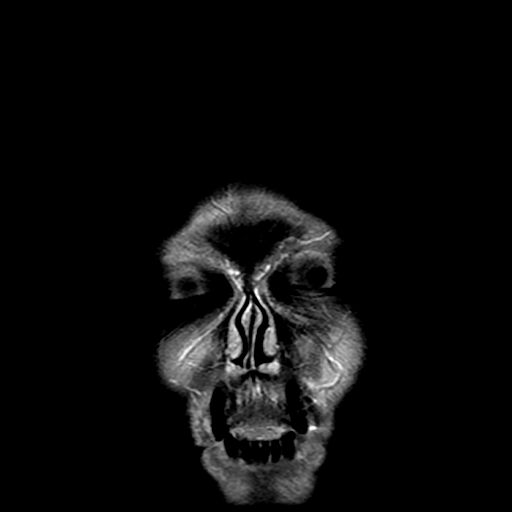

[Series 4: T1 · sagittal · 5.0mm · 0.47mm/px · 2 of 24 slices shown]
[im 1/24]
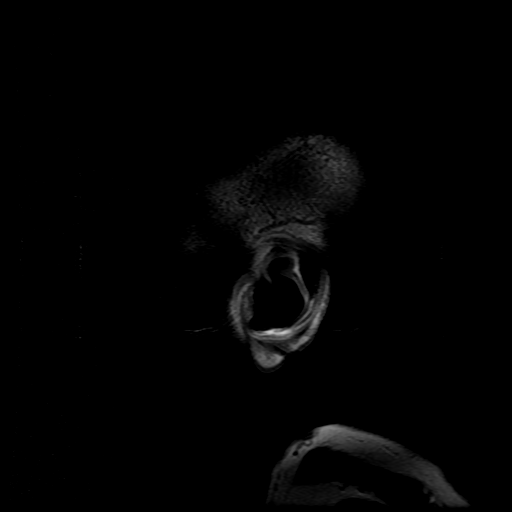
[im 24/24]
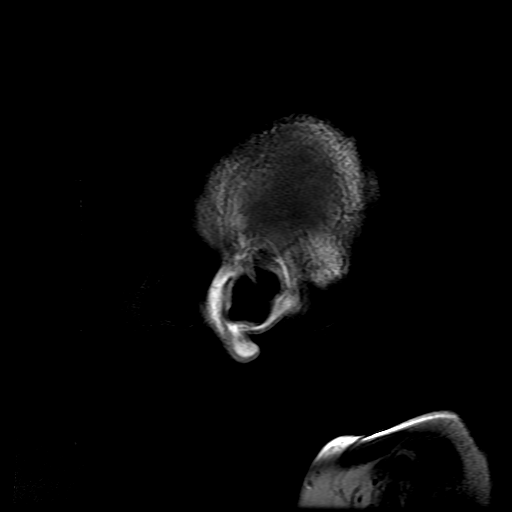

[Series 5: DWI · coronal · 5.0mm · 1.09mm/px · 6 of 66 slices shown (2 of 4)]
[im 1/66]
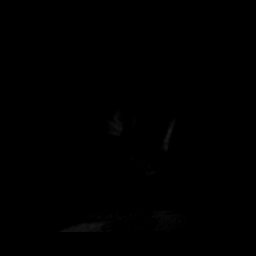
[im 14/66]
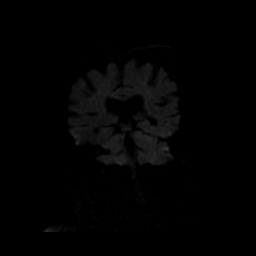
[im 27/66]
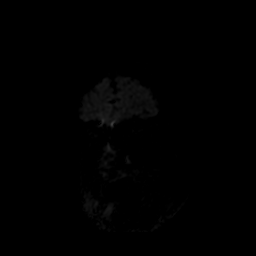
[im 40/66]
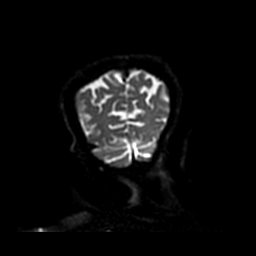
[im 53/66]
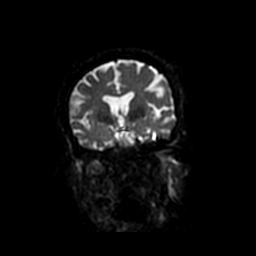
[im 66/66]
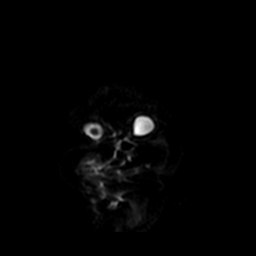

[Series 5: T1 post-contrast · sagittal · 5.0mm · 0.47mm/px · 2 of 23 slices shown (2 of 2)]
[im 1/23]
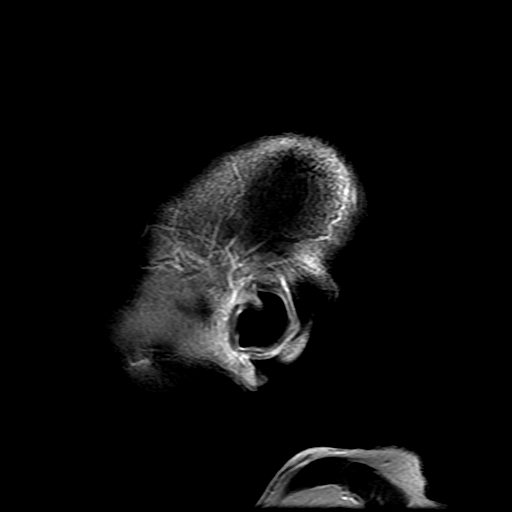
[im 23/23]
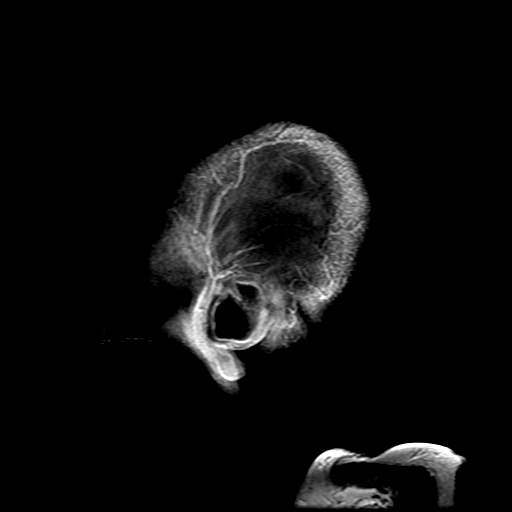

[Series 6: T2 · axial · 5.0mm · 0.43mm/px · z∈[-35,+119]mm · 2 of 23 slices shown (1 of 2)]
[im 1/23]
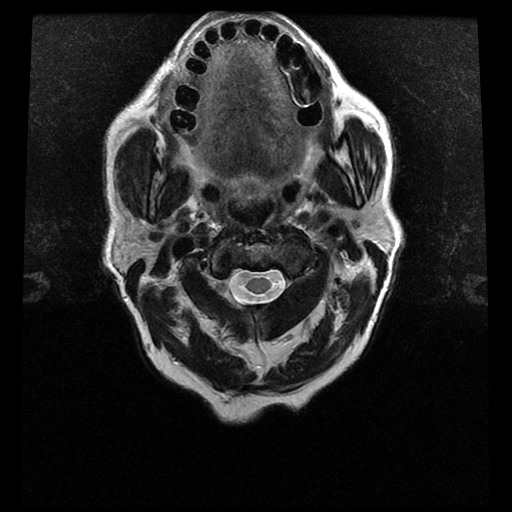
[im 23/23]
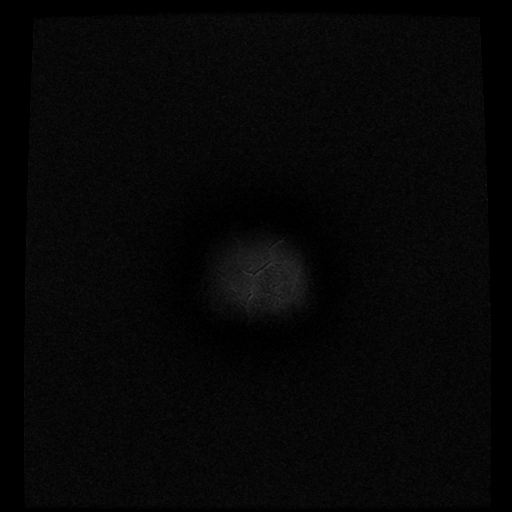

[Series 7: FLAIR · axial · 3.0mm · 0.43mm/px · z∈[-36,+120]mm · 2 of 27 slices shown]
[im 1/27]
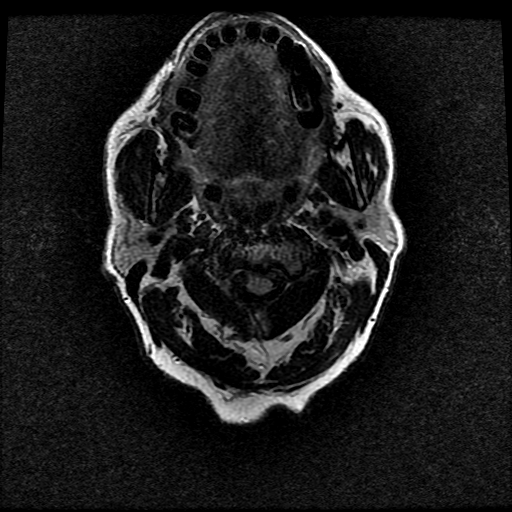
[im 27/27]
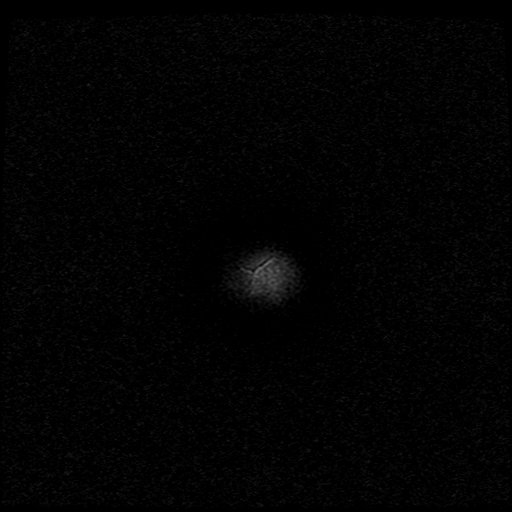

[Series 10: T2 · coronal · 5.0mm · 0.45mm/px · 2 of 26 slices shown (2 of 2)]
[im 1/26]
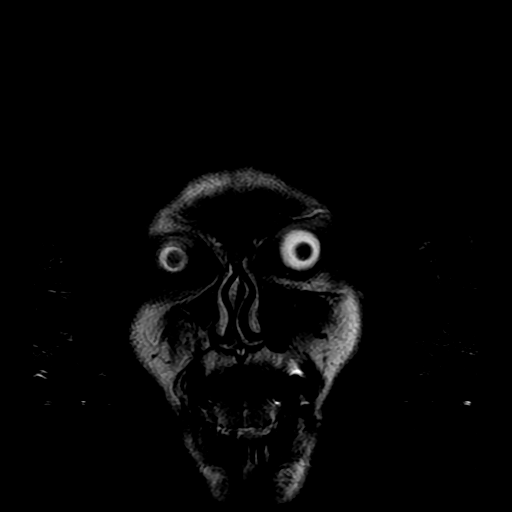
[im 26/26]
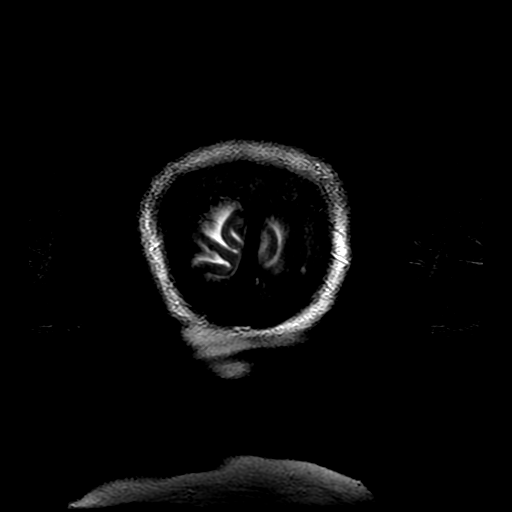

[Series 300: DWI · axial · 3.0mm · 1.09mm/px · z∈[-30,+116]mm · 5 of 50 slices shown (3 of 4)]
[im 1/50]
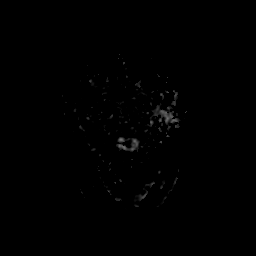
[im 13/50]
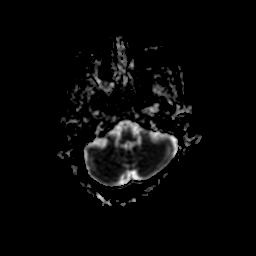
[im 25/50]
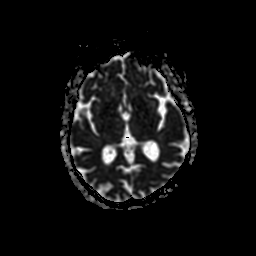
[im 37/50]
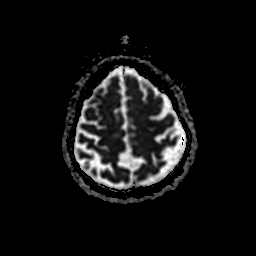
[im 50/50]
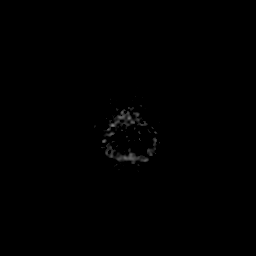

[Series 500: DWI · coronal · 5.0mm · 1.09mm/px · 3 of 33 slices shown (4 of 4)]
[im 1/33]
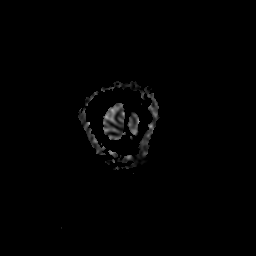
[im 17/33]
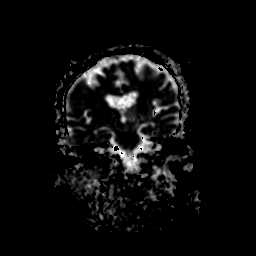
[im 33/33]
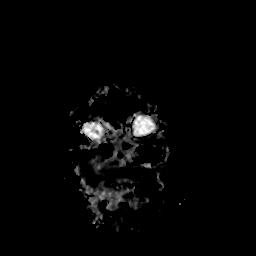

[35 of 48 positions shown; findings below may reference images not displayed]

FINDINGS: Brain: No acute infarction, hemorrhage, hydrocephalus, extra-axial
collection or mass lesion. Fewnonspecific foci of T2 FLAIR
hyperintense signal abnormality in subcortical and periventricular
white matter are compatible withmildchronic microvascular ischemic
changes for age. Mildbrain parenchymal volume loss. Small foci of
susceptibility hypointensity are present in the right occipital lobe
and left cerebral peduncle, probably hemosiderin deposition of
chronic microhemorrhage. No abnormal enhancement.

Vascular: Normal flow voids.

Skull and upper cervical spine: Normal marrow signal.

Sinuses/Orbits: Moderate right maxillary sinus mucosal thickening
with mucous retention cyst. Otherwise negative.

Other: 12 mm round low signal focus within left floor of mouth and
upstream dilatation of left submandibular gland duct, probably a
sialolith (series 10, image 21).
IMPRESSION: 1. No acute intracranial process or abnormal enhancement.
2. Mild for age chronic microvascular ischemic changes and
parenchymal volume loss of the brain.
3. Probable 12 mm sialolith within the left floor of mouth with mild
upstream dilatation of the left submandibular gland duct.

By: Ailian Shuen M.D.
